# Patient Record
Sex: Female | Born: 1965 | Race: White | Hispanic: No | Marital: Married | Smoking: Never smoker
Health system: Southern US, Community
[De-identification: ages and names within clinical notes are randomized; demographics above are authoritative.]

## PROBLEM LIST (undated history)

## (undated) DIAGNOSIS — E559 Vitamin D deficiency, unspecified: Secondary | ICD-10-CM

## (undated) DIAGNOSIS — K219 Gastro-esophageal reflux disease without esophagitis: Secondary | ICD-10-CM

## (undated) DIAGNOSIS — D649 Anemia, unspecified: Secondary | ICD-10-CM

## (undated) DIAGNOSIS — R059 Cough, unspecified: Secondary | ICD-10-CM

## (undated) HISTORY — DX: Cough, unspecified: R05.9

## (undated) HISTORY — DX: Anemia, unspecified: D64.9

## (undated) HISTORY — DX: Gastro-esophageal reflux disease without esophagitis: K21.9

## (undated) HISTORY — DX: Vitamin D deficiency, unspecified: E55.9

---

## 1991-09-08 HISTORY — PX: TUBAL LIGATION: SHX77

## 2000-04-05 ENCOUNTER — Other Ambulatory Visit: Admission: RE | Admit: 2000-04-05 | Discharge: 2000-04-05 | Payer: Self-pay | Admitting: Obstetrics and Gynecology

## 2002-02-01 ENCOUNTER — Other Ambulatory Visit: Admission: RE | Admit: 2002-02-01 | Discharge: 2002-02-01 | Payer: Self-pay | Admitting: Obstetrics and Gynecology

## 2004-10-30 ENCOUNTER — Encounter: Admission: RE | Admit: 2004-10-30 | Discharge: 2004-10-30 | Payer: Self-pay | Admitting: Obstetrics and Gynecology

## 2008-05-31 ENCOUNTER — Ambulatory Visit (HOSPITAL_COMMUNITY): Admission: RE | Admit: 2008-05-31 | Discharge: 2008-05-31 | Payer: Self-pay | Admitting: Family Medicine

## 2009-11-28 ENCOUNTER — Emergency Department (HOSPITAL_COMMUNITY): Admission: EM | Admit: 2009-11-28 | Discharge: 2009-11-28 | Payer: Self-pay | Admitting: Emergency Medicine

## 2010-09-08 ENCOUNTER — Emergency Department (HOSPITAL_COMMUNITY)
Admission: EM | Admit: 2010-09-08 | Discharge: 2010-09-08 | Payer: Self-pay | Source: Home / Self Care | Admitting: Emergency Medicine

## 2010-09-27 ENCOUNTER — Other Ambulatory Visit: Payer: Self-pay | Admitting: *Deleted

## 2010-09-27 DIAGNOSIS — Z1239 Encounter for other screening for malignant neoplasm of breast: Secondary | ICD-10-CM

## 2010-09-29 ENCOUNTER — Encounter: Payer: Self-pay | Admitting: Family Medicine

## 2010-10-02 ENCOUNTER — Other Ambulatory Visit: Payer: Self-pay

## 2010-10-02 DIAGNOSIS — Z1239 Encounter for other screening for malignant neoplasm of breast: Secondary | ICD-10-CM

## 2010-11-13 ENCOUNTER — Emergency Department (HOSPITAL_COMMUNITY): Payer: 59

## 2010-11-13 ENCOUNTER — Emergency Department (HOSPITAL_COMMUNITY)
Admission: EM | Admit: 2010-11-13 | Discharge: 2010-11-13 | Disposition: A | Discharge status: Home / Self Care | Payer: 59 | Attending: Emergency Medicine | Admitting: Emergency Medicine

## 2010-11-13 DIAGNOSIS — S0510XA Contusion of eyeball and orbital tissues, unspecified eye, initial encounter: Secondary | ICD-10-CM | POA: Insufficient documentation

## 2010-11-13 DIAGNOSIS — W1809XA Striking against other object with subsequent fall, initial encounter: Secondary | ICD-10-CM | POA: Insufficient documentation

## 2010-11-13 DIAGNOSIS — Y9239 Other specified sports and athletic area as the place of occurrence of the external cause: Secondary | ICD-10-CM | POA: Insufficient documentation

## 2010-11-13 DIAGNOSIS — M533 Sacrococcygeal disorders, not elsewhere classified: Secondary | ICD-10-CM | POA: Insufficient documentation

## 2010-11-13 DIAGNOSIS — Y92838 Other recreation area as the place of occurrence of the external cause: Secondary | ICD-10-CM | POA: Insufficient documentation

## 2010-11-13 DIAGNOSIS — M549 Dorsalgia, unspecified: Secondary | ICD-10-CM | POA: Insufficient documentation

## 2010-11-24 ENCOUNTER — Ambulatory Visit
Admission: RE | Admit: 2010-11-24 | Discharge: 2010-11-24 | Disposition: A | Discharge status: Home / Self Care | Payer: 59 | Source: Ambulatory Visit | Attending: *Deleted | Admitting: *Deleted

## 2010-11-24 DIAGNOSIS — Z1239 Encounter for other screening for malignant neoplasm of breast: Secondary | ICD-10-CM

## 2011-10-05 ENCOUNTER — Other Ambulatory Visit (HOSPITAL_COMMUNITY): Payer: Self-pay | Admitting: Family Medicine

## 2011-10-05 DIAGNOSIS — R1011 Right upper quadrant pain: Secondary | ICD-10-CM

## 2011-10-07 ENCOUNTER — Ambulatory Visit (HOSPITAL_COMMUNITY)
Admission: RE | Admit: 2011-10-07 | Discharge: 2011-10-07 | Disposition: A | Discharge status: Home / Self Care | Payer: BC Managed Care – PPO | Source: Ambulatory Visit | Attending: Family Medicine | Admitting: Family Medicine

## 2011-10-07 DIAGNOSIS — R1011 Right upper quadrant pain: Secondary | ICD-10-CM | POA: Insufficient documentation

## 2011-11-16 ENCOUNTER — Telehealth: Payer: Self-pay | Admitting: *Deleted

## 2011-11-16 NOTE — Telephone Encounter (Signed)
Opened in error, wrong pt

## 2012-02-15 ENCOUNTER — Other Ambulatory Visit: Payer: Self-pay | Admitting: Family Medicine

## 2012-02-15 DIAGNOSIS — Z1231 Encounter for screening mammogram for malignant neoplasm of breast: Secondary | ICD-10-CM

## 2012-02-17 ENCOUNTER — Ambulatory Visit
Admission: RE | Admit: 2012-02-17 | Discharge: 2012-02-17 | Disposition: A | Discharge status: Home / Self Care | Payer: BC Managed Care – PPO | Source: Ambulatory Visit | Attending: Family Medicine | Admitting: Family Medicine

## 2012-02-17 DIAGNOSIS — Z1231 Encounter for screening mammogram for malignant neoplasm of breast: Secondary | ICD-10-CM

## 2012-04-11 ENCOUNTER — Telehealth: Payer: Self-pay | Admitting: *Deleted

## 2012-04-11 NOTE — Telephone Encounter (Signed)
Opened in error, wrong pt

## 2013-10-02 ENCOUNTER — Other Ambulatory Visit: Payer: Self-pay

## 2013-10-02 DIAGNOSIS — Z1231 Encounter for screening mammogram for malignant neoplasm of breast: Secondary | ICD-10-CM

## 2013-10-16 ENCOUNTER — Ambulatory Visit
Admission: RE | Admit: 2013-10-16 | Discharge: 2013-10-16 | Disposition: A | Discharge status: Home / Self Care | Payer: BC Managed Care – PPO | Source: Ambulatory Visit

## 2013-10-16 DIAGNOSIS — Z1231 Encounter for screening mammogram for malignant neoplasm of breast: Secondary | ICD-10-CM

## 2014-02-28 ENCOUNTER — Other Ambulatory Visit (HOSPITAL_COMMUNITY): Payer: Self-pay | Admitting: Family Medicine

## 2014-02-28 DIAGNOSIS — R1011 Right upper quadrant pain: Secondary | ICD-10-CM

## 2014-03-05 ENCOUNTER — Encounter (HOSPITAL_COMMUNITY): Payer: Self-pay

## 2014-03-05 ENCOUNTER — Encounter (HOSPITAL_COMMUNITY)
Admission: RE | Admit: 2014-03-05 | Discharge: 2014-03-05 | Disposition: A | Discharge status: Home / Self Care | Payer: BC Managed Care – PPO | Source: Ambulatory Visit | Attending: Family Medicine | Admitting: Family Medicine

## 2014-03-05 DIAGNOSIS — R1011 Right upper quadrant pain: Secondary | ICD-10-CM | POA: Insufficient documentation

## 2014-03-05 MED ORDER — SINCALIDE 5 MCG IJ SOLR
INTRAMUSCULAR | Status: AC
  Administered 2014-03-05: 1.68 ug via INTRAVENOUS
  Filled 2014-03-05: qty 5

## 2014-03-05 MED ORDER — STERILE WATER FOR INJECTION IJ SOLN
INTRAMUSCULAR | Status: AC
  Administered 2014-03-05: 5 mL via INTRAVENOUS
  Filled 2014-03-05: qty 10

## 2014-03-05 MED ORDER — TECHNETIUM TC 99M MEBROFENIN IV KIT
5.0000 | PACK | Freq: Once | INTRAVENOUS | Status: AC | PRN
  Administered 2014-03-05: 5 via INTRAVENOUS

## 2014-03-12 ENCOUNTER — Ambulatory Visit (HOSPITAL_COMMUNITY): Payer: BC Managed Care – PPO

## 2014-07-15 ENCOUNTER — Encounter (HOSPITAL_COMMUNITY): Payer: Self-pay | Admitting: Nurse Practitioner

## 2014-07-15 ENCOUNTER — Emergency Department (HOSPITAL_COMMUNITY)
Admission: EM | Admit: 2014-07-15 | Discharge: 2014-07-15 | Disposition: A | Discharge status: Home / Self Care | Payer: BC Managed Care – PPO | Attending: Emergency Medicine | Admitting: Emergency Medicine

## 2014-07-15 ENCOUNTER — Emergency Department (HOSPITAL_COMMUNITY): Payer: BC Managed Care – PPO

## 2014-07-15 DIAGNOSIS — R0789 Other chest pain: Secondary | ICD-10-CM | POA: Insufficient documentation

## 2014-07-15 DIAGNOSIS — R079 Chest pain, unspecified: Secondary | ICD-10-CM | POA: Diagnosis present

## 2014-07-15 DIAGNOSIS — R002 Palpitations: Secondary | ICD-10-CM | POA: Insufficient documentation

## 2014-07-15 LAB — BASIC METABOLIC PANEL
Anion gap: 12 (ref 5–15)
BUN: 14 mg/dL (ref 6–23)
CHLORIDE: 104 meq/L (ref 96–112)
CO2: 26 mEq/L (ref 19–32)
Calcium: 9.6 mg/dL (ref 8.4–10.5)
Creatinine, Ser: 0.7 mg/dL (ref 0.50–1.10)
GFR calc Af Amer: >90 mL/min (ref >90)
GLUCOSE: 68 mg/dL — AB (ref 70–99)
POTASSIUM: 4 meq/L (ref 3.7–5.3)
Sodium: 142 mEq/L (ref 137–147)

## 2014-07-15 LAB — CBC
HEMATOCRIT: 41.8 % (ref 36.0–46.0)
HEMOGLOBIN: 14.2 g/dL (ref 12.0–15.0)
MCH: 31.3 pg (ref 26.0–34.0)
MCHC: 34 g/dL (ref 30.0–36.0)
MCV: 92.1 fL (ref 78.0–100.0)
Platelets: 341 10*3/uL (ref 150–400)
RBC: 4.54 MIL/uL (ref 3.87–5.11)
RDW: 14.7 % (ref 11.5–15.5)
WBC: 5 10*3/uL (ref 4.0–10.5)

## 2014-07-15 LAB — PRO B NATRIURETIC PEPTIDE: PRO B NATRI PEPTIDE: 122.2 pg/mL (ref 0–125)

## 2014-07-15 LAB — I-STAT TROPONIN, ED: Troponin i, poc: 0.02 ng/mL (ref 0.00–0.08)

## 2014-07-15 LAB — TSH: TSH: 1.06 u[IU]/mL (ref 0.350–4.500)

## 2014-07-15 NOTE — ED Notes (Addendum)
Pt c/o chest pain intermittent x months. States it got worse last night. Symptoms increase at night when she is at rest. States she can feel her heart beating fast at night and for the last several nights shes had R jaw and L arm pain. She reports SOB at times also

## 2014-07-15 NOTE — Discharge Instructions (Signed)
It was our pleasure to provide your ER care today - we hope that you feel better.  Follow up with cardiology in the coming week - see referral - call office Monday to arrange appointment - discuss possible further evaluation of symptoms, and/or Holter monitor.   Return to ER if worse, new symptoms, fevers, difficulty breathing, recurrent or persistent chest pain, persistent fast heart beat, fainting, other concern.     Palpitations A palpitation is the feeling that your heartbeat is irregular or is faster than normal. It may feel like your heart is fluttering or skipping a beat. Palpitations are usually not a serious problem. However, in some cases, you may need further medical evaluation. CAUSES  Palpitations can be caused by:  Smoking.  Caffeine or other stimulants, such as diet pills or energy drinks.  Alcohol.  Stress and anxiety.  Strenuous physical activity.  Fatigue.  Certain medicines.  Heart disease, especially if you have a history of irregular heart rhythms (arrhythmias), such as atrial fibrillation, atrial flutter, or supraventricular tachycardia.  An improperly working pacemaker or defibrillator. DIAGNOSIS  To find the cause of your palpitations, your health care provider will take your medical history and perform a physical exam. Your health care provider may also have you take a test called an ambulatory electrocardiogram (ECG). An ECG records your heartbeat patterns over a 24-hour period. You may also have other tests, such as:  Transthoracic echocardiogram (TTE). During echocardiography, sound waves are used to evaluate how blood flows through your heart.  Transesophageal echocardiogram (TEE).  Cardiac monitoring. This allows your health care provider to monitor your heart rate and rhythm in real time.  Holter monitor. This is a portable device that records your heartbeat and can help diagnose heart arrhythmias. It allows your health care provider to track your  heart activity for several days, if needed.  Stress tests by exercise or by giving medicine that makes the heart beat faster. TREATMENT  Treatment of palpitations depends on the cause of your symptoms and can vary greatly. Most cases of palpitations do not require any treatment other than time, relaxation, and monitoring your symptoms. Other causes, such as atrial fibrillation, atrial flutter, or supraventricular tachycardia, usually require further treatment. HOME CARE INSTRUCTIONS   Avoid:  Caffeinated coffee, tea, soft drinks, diet pills, and energy drinks.  Chocolate.  Alcohol.  Stop smoking if you smoke.  Reduce your stress and anxiety. Things that can help you relax include:  A method of controlling things in your body, such as your heartbeats, with your mind (biofeedback).  Yoga.  Meditation.  Physical activity such as swimming, jogging, or walking.  Get plenty of rest and sleep. SEEK MEDICAL CARE IF:   You continue to have a fast or irregular heartbeat beyond 24 hours.  Your palpitations occur more often. SEEK IMMEDIATE MEDICAL CARE IF:  You have chest pain or shortness of breath.  You have a severe headache.  You feel dizzy or you faint. MAKE SURE YOU:  Understand these instructions.  Will watch your condition.  Will get help right away if you are not doing well or get worse. Document Released: 08/21/2000 Document Revised: 08/29/2013 Document Reviewed: 10/23/2011 [facility] Patient Information 2015 [place], [place]. This information is not intended to replace advice given to you by your health care provider. Make sure you discuss any questions you have with your health care provider.     Chest Pain (Nonspecific) It is often hard to give a specific diagnosis for the cause of  chest pain. There is always a chance that your pain could be related to something serious, such as a heart attack or a blood clot in the lungs. You need to follow up with your health  care provider for further evaluation. CAUSES   Heartburn.  Pneumonia or bronchitis.  Anxiety or stress.  Inflammation around your heart (pericarditis) or lung (pleuritis or pleurisy).  A blood clot in the lung.  A collapsed lung (pneumothorax). It can develop suddenly on its own (spontaneous pneumothorax) or from trauma to the chest.  Shingles infection (herpes zoster virus). The chest wall is composed of bones, muscles, and cartilage. Any of these can be the source of the pain.  The bones can be bruised by injury.  The muscles or cartilage can be strained by coughing or overwork.  The cartilage can be affected by inflammation and become sore (costochondritis). DIAGNOSIS  Lab tests or other studies may be needed to find the cause of your pain. Your health care provider may have you take a test called an ambulatory electrocardiogram (ECG). An ECG records your heartbeat patterns over a 24-hour period. You may also have other tests, such as:  Transthoracic echocardiogram (TTE). During echocardiography, sound waves are used to evaluate how blood flows through your heart.  Transesophageal echocardiogram (TEE).  Cardiac monitoring. This allows your health care provider to monitor your heart rate and rhythm in real time.  Holter monitor. This is a portable device that records your heartbeat and can help diagnose heart arrhythmias. It allows your health care provider to track your heart activity for several days, if needed.  Stress tests by exercise or by giving medicine that makes the heart beat faster. TREATMENT   Treatment depends on what may be causing your chest pain. Treatment may include:  Acid blockers for heartburn.  Anti-inflammatory medicine.  Pain medicine for inflammatory conditions.  Antibiotics if an infection is present.  You may be advised to change lifestyle habits. This includes stopping smoking and avoiding alcohol, caffeine, and chocolate.  You may be  advised to keep your head raised (elevated) when sleeping. This reduces the chance of acid going backward from your stomach into your esophagus. Most of the time, nonspecific chest pain will improve within 2-3 days with rest and mild pain medicine.  HOME CARE INSTRUCTIONS   If antibiotics were prescribed, take them as directed. Finish them even if you start to feel better.  For the next few days, avoid physical activities that bring on chest pain. Continue physical activities as directed.  Do not use any tobacco products, including cigarettes, chewing tobacco, or electronic cigarettes.  Avoid drinking alcohol.  Only take medicine as directed by your health care provider.  Follow your health care provider's suggestions for further testing if your chest pain does not go away.  Keep any follow-up appointments you made. If you do not go to an appointment, you could develop lasting (chronic) problems with pain. If there is any problem keeping an appointment, call to reschedule. SEEK MEDICAL CARE IF:   Your chest pain does not go away, even after treatment.  You have a rash with blisters on your chest.  You have a fever. SEEK IMMEDIATE MEDICAL CARE IF:   You have increased chest pain or pain that spreads to your arm, neck, jaw, back, or abdomen.  You have shortness of breath.  You have an increasing cough, or you cough up blood.  You have severe back or abdominal pain.  You feel nauseous or  vomit.  You have severe weakness.  You faint.  You have chills. This is an emergency. Do not wait to see if the pain will go away. Get medical help at once. Call your local emergency services (911 in [place] Do not drive yourself to the hospital. MAKE SURE YOU:   Understand these instructions.  Will watch your condition.  Will get help right away if you are not doing well or get worse. Document Released: 06/03/2005 Document Revised: 08/29/2013 Document Reviewed: 03/29/2008 [facility]  Patient Information 2015 [place], [place]. This information is not intended to replace advice given to you by your health care provider. Make sure you discuss any questions you have with your health care provider.     Shortness of Breath Shortness of breath means you have trouble breathing. It could also mean that you have a medical problem. You should get immediate medical care for shortness of breath. CAUSES   Not enough oxygen in the air such as with high altitudes or a smoke-filled room.  Certain lung diseases, infections, or problems.  Heart disease or conditions, such as angina or heart failure.  Low red blood cells (anemia).  Poor physical fitness, which can cause shortness of breath when you exercise.  Chest or back injuries or stiffness.  Being overweight.  Smoking.  Anxiety, which can make you feel like you are not getting enough air. DIAGNOSIS  Serious medical problems can often be found during your physical exam. Tests may also be done to determine why you are having shortness of breath. Tests may include:  Chest X-rays.  Lung function tests.  Blood tests.  An electrocardiogram (ECG).  An ambulatory electrocardiogram. An ambulatory ECG records your heartbeat patterns over a 24-hour period.  Exercise testing.  A transthoracic echocardiogram (TTE). During echocardiography, sound waves are used to evaluate how blood flows through your heart.  A transesophageal echocardiogram (TEE).  Imaging scans. Your health care provider may not be able to find a cause for your shortness of breath after your exam. In this case, it is important to have a follow-up exam with your health care provider as directed.  TREATMENT  Treatment for shortness of breath depends on the cause of your symptoms and can vary greatly. HOME CARE INSTRUCTIONS   Do not smoke. Smoking is a common cause of shortness of breath. If you smoke, ask for help to quit.  Avoid being around chemicals or  things that may bother your breathing, such as paint fumes and dust.  Rest as needed. Slowly resume your usual activities.  If medicines were prescribed, take them as directed for the full length of time directed. This includes oxygen and any inhaled medicines.  Keep all follow-up appointments as directed by your health care provider. SEEK MEDICAL CARE IF:   Your condition does not improve in the time expected.  You have a hard time doing your normal activities even with rest.  You have any new symptoms. SEEK IMMEDIATE MEDICAL CARE IF:   Your shortness of breath gets worse.  You feel light-headed, faint, or develop a cough not controlled with medicines.  You start coughing up blood.  You have pain with breathing.  You have chest pain or pain in your arms, shoulders, or abdomen.  You have a fever.  You are unable to walk up stairs or exercise the way you normally do. MAKE SURE YOU:  Understand these instructions.  Will watch your condition.  Will get help right away if you are not doing well  or get worse. Document Released: 05/19/2001 Document Revised: 08/29/2013 Document Reviewed: 11/09/2011 [facility] Patient Information 2015 [place], [place]. This information is not intended to replace advice given to you by your health care provider. Make sure you discuss any questions you have with your health care provider.

## 2014-07-15 NOTE — ED Notes (Signed)
Patient transported to X-ray 

## 2014-07-15 NOTE — ED Provider Notes (Signed)
CSN: [id number]     Arrival date & time 07/15/14  1155 History   First MD Initiated Contact with Patient 07/15/14 1205     Chief Complaint  Patient presents with  . Chest Pain     (Consider location/radiation/quality/duration/timing/severity/associated sxs/prior Treatment) Patient is a 48 y.o. female presenting with chest pain. The history is provided by the patient.  Chest Pain Associated symptoms: palpitations   Associated symptoms: no abdominal pain, no back pain, no fever, no headache, no numbness, no shortness of breath, not vomiting and no weakness   pt states feeling of mild sob x several months - no recent change. Indicates in past couple nights, when at rest, has noted intermittent sense of palpitations, at times rapid, at other times more irregular, lasts seconds. No hx syncope. Also states last night was awakened w transient right jaw pain, then later was awakened with left elbow pain.  Pt denies any current jaw or elbow pain. No exertional cp or discomfort of any sort. No unusual fatigue. w above symptoms, denies any associated nv or diaphoresis. Denies hx dysrhythmia or hx cad. No fam hx cad. Denies heat intolerance, sweats, unexplained wt loss, or hx thyroid disease. No recent medication use. Occasionally drinks 1 caffeinated beverage per day. No energy drink use. Non smoker.  Pt denies leg pain or swelling. No orthopnea or pnd. No hx dvt or pe.       History reviewed. No pertinent past medical history. Past Surgical History  Procedure Laterality Date  . Tubal ligation     History reviewed. No pertinent family history. History  Substance Use Topics  . Smoking status: Never Smoker   . Smokeless tobacco: Not on file  . Alcohol Use: No   OB History    No data available     Review of Systems  Constitutional: Negative for fever and chills.  HENT: Negative for sore throat.   Eyes: Negative for visual disturbance.  Respiratory: Negative for shortness of breath.    Cardiovascular: Positive for palpitations. Negative for leg swelling.  Gastrointestinal: Negative for vomiting and abdominal pain.  Genitourinary: Negative for flank pain.  Musculoskeletal: Negative for back pain and neck pain.  Skin: Negative for rash.  Neurological: Negative for syncope, weakness, numbness and headaches.  Hematological: Does not bruise/bleed easily.  Psychiatric/Behavioral: Negative for confusion.      Allergies  Review of patient's allergies indicates no known allergies.  Home Medications   Prior to Admission medications   Not on File   BP 146/79 mmHg  Pulse 55  Temp(Src) 97.6 F (36.4 C) (Oral)  Resp 18  SpO2 100% Physical Exam  Constitutional: She appears well-developed and well-nourished. No distress.  HENT:  Mouth/Throat: Oropharynx is clear and moist.  Eyes: Conjunctivae are normal. No scleral icterus.  Neck: Neck supple. No tracheal deviation present. No thyromegaly present.  Cardiovascular: Normal rate, regular rhythm, normal heart sounds and intact distal pulses.  Exam reveals no gallop and no friction rub.   No murmur heard. Pulmonary/Chest: Effort normal and breath sounds normal. No respiratory distress.  Abdominal: Soft. Normal appearance and bowel sounds are normal. She exhibits no distension. There is no tenderness.  Musculoskeletal: She exhibits no edema.  Neurological: She is alert.  Skin: Skin is warm and dry. No rash noted. She is not diaphoretic.  Psychiatric: She has a normal mood and affect.  Nursing note and vitals reviewed.   ED Course  Procedures (including critical care time) Labs Review  Results for orders placed  or performed during the hospital encounter of 07/15/14  CBC  Result Value Ref Range   WBC 5.0 4.0 - 10.5 K/uL   RBC 4.54 3.87 - 5.11 MIL/uL   Hemoglobin 14.2 12.0 - 15.0 g/dL   HCT 40.941.8 81.136.0 - 91.446.0 %   MCV 92.1 78.0 - 100.0 fL   MCH 31.3 26.0 - 34.0 pg   MCHC 34.0 30.0 - 36.0 g/dL   RDW 78.214.7 95.611.5 - 21.315.5 %    Platelets 341 150 - 400 K/uL  Basic metabolic panel  Result Value Ref Range   Sodium 142 137 - 147 mEq/L   Potassium 4.0 3.7 - 5.3 mEq/L   Chloride 104 96 - 112 mEq/L   CO2 26 19 - 32 mEq/L   Glucose, Bld 68 (L) 70 - 99 mg/dL   BUN 14 6 - 23 mg/dL   Creatinine, Ser 0.860.70 0.50 - 1.10 mg/dL   Calcium 9.6 8.4 - 57.810.5 mg/dL   GFR calc non Af Amer >90 >90 mL/min   GFR calc Af Amer >90 >90 mL/min   Anion gap 12 5 - 15  BNP (order ONLY if patient complains of dyspnea/SOB AND you have documented it for THIS visit)  Result Value Ref Range   Pro B Natriuretic peptide (BNP) 122.2 0 - 125 pg/mL  TSH  Result Value Ref Range   TSH 1.060 0.350 - 4.500 uIU/mL  I-stat troponin, ED [facility])  Result Value Ref Range   Troponin i, poc 0.02 0.00 - 0.08 ng/mL   Comment 3           Dg Chest 2 View  07/15/2014   CLINICAL DATA:  48 year old female with chest pain radiating to the left arm and sensation of heart racing  EXAM: CHEST  2 VIEW  COMPARISON:  Prior chest x-ray 06/11/2014  FINDINGS: Stable cardiac and mediastinal contours which remain within normal limits. No focal airspace consolidation, evidence of pulmonary edema, pleural effusion or pneumothorax. No acute osseous abnormality. Degenerative change at the right acromioclavicular which.  IMPRESSION: No active cardiopulmonary disease.   Electronically Signed   By: Malachy MoanHeath  McCullough M.D.   On: 07/15/2014 13:08      EKG Interpretation   Date/Time:  Sunday July 15 2014 12:02:48 EST Ventricular Rate:  54 PR Interval:  130 QRS Duration: 84 QT Interval:  410 QTC Calculation: 388 R Axis:   1 Text Interpretation:  Sinus bradycardia No significant change since last  tracing Confirmed by Denton LankSTEINL  MD, Caryn BeeKEVIN ([id number]) on 07/15/2014 12:17:33 PM      MDM   Labs. Cxr. Ecg.  Reviewed nursing notes and prior charts for additional history.   Recheck nsr on monitor, no ectopy or dysrhythmia.  Hr 62, rr 14, pulse ox 99%.   No pain or  discomfort, breathing comfortably.  Discussed lab results w pt.  Given palpitations and atyp cp, will have f/u with cardiology, outpt f/u.      Suzi RootsKevin E Yuvonne Lanahan, MD 07/15/14 1430

## 2014-07-16 LAB — T4, FREE: FREE T4: 1.16 ng/dL (ref 0.80–1.80)

## 2014-11-23 ENCOUNTER — Other Ambulatory Visit: Payer: Self-pay

## 2014-11-23 DIAGNOSIS — Z1231 Encounter for screening mammogram for malignant neoplasm of breast: Secondary | ICD-10-CM

## 2014-11-29 ENCOUNTER — Ambulatory Visit
Admission: RE | Admit: 2014-11-29 | Discharge: 2014-11-29 | Disposition: A | Discharge status: Home / Self Care | Payer: BLUE CROSS/BLUE SHIELD | Source: Ambulatory Visit

## 2014-11-29 DIAGNOSIS — Z1231 Encounter for screening mammogram for malignant neoplasm of breast: Secondary | ICD-10-CM

## 2014-11-30 ENCOUNTER — Other Ambulatory Visit: Payer: Self-pay | Admitting: Family Medicine

## 2014-11-30 DIAGNOSIS — R928 Other abnormal and inconclusive findings on diagnostic imaging of breast: Secondary | ICD-10-CM

## 2014-12-03 ENCOUNTER — Other Ambulatory Visit: Payer: Self-pay | Admitting: Family Medicine

## 2014-12-03 ENCOUNTER — Ambulatory Visit
Admission: RE | Admit: 2014-12-03 | Discharge: 2014-12-03 | Disposition: A | Discharge status: Home / Self Care | Payer: BLUE CROSS/BLUE SHIELD | Source: Ambulatory Visit | Attending: Family Medicine | Admitting: Family Medicine

## 2014-12-03 DIAGNOSIS — R928 Other abnormal and inconclusive findings on diagnostic imaging of breast: Secondary | ICD-10-CM

## 2015-01-06 HISTORY — PX: UPPER GASTROINTESTINAL ENDOSCOPY: SHX188

## 2015-01-17 ENCOUNTER — Encounter: Payer: Self-pay | Admitting: Gastroenterology

## 2015-01-17 ENCOUNTER — Ambulatory Visit (INDEPENDENT_AMBULATORY_CARE_PROVIDER_SITE_OTHER): Payer: BLUE CROSS/BLUE SHIELD | Admitting: Gastroenterology

## 2015-01-17 VITALS — BP 128/82 | HR 67 | Ht 63.0 in | Wt 197.6 lb

## 2015-01-17 DIAGNOSIS — R079 Chest pain, unspecified: Secondary | ICD-10-CM | POA: Diagnosis not present

## 2015-01-17 DIAGNOSIS — K219 Gastro-esophageal reflux disease without esophagitis: Secondary | ICD-10-CM

## 2015-01-17 MED ORDER — PANTOPRAZOLE SODIUM 40 MG PO TBEC: 40.0000 mg | DELAYED_RELEASE_TABLET | Freq: Every day | ORAL | Status: DC

## 2015-01-17 NOTE — Progress Notes (Signed)
    History of Present Illness: This is a 49 year old female referred by Richardean Chimeraaniel, Terry G, MD for the evaluation of chest pain. She relates the onset of recurrent midsternal chest pain beginning in November 2015. CCK HIDA was normal. She states she had a exercise stress test that was also normal. Over the past few months she has noted frequent postprandial and morning belching with heartburn. She notes frequent heartburn and reflux symptoms with recumbency. She was treated with OTC Prilosec for 2 weeks with improvement in her symptoms however she is no longer on any acid reducing medication. Denies weight loss, abdominal pain, constipation, diarrhea, change in stool caliber, melena, hematochezia, nausea, vomiting, dysphagia.   Review of Systems: Pertinent positive and negative review of systems were noted in the above HPI section. All other review of systems were otherwise negative.  Current Medications, Allergies, Past Medical History, Past Surgical History, Family History and Social History [facility].  Physical Exam: General: Well developed, well nourished, no acute distress Head: Normocephalic and atraumatic Eyes:  sclerae anicteric, EOMI Ears: Normal auditory acuity Mouth: No deformity or lesions Neck: Supple, no masses or thyromegaly Lungs: Clear throughout to auscultation Heart: Regular rate and rhythm; no murmurs, rubs or bruits Abdomen: Soft, non tender and non distended. No masses, hepatosplenomegaly or hernias noted. Normal Bowel sounds Musculoskeletal: Symmetrical with no gross deformities  Skin: No lesions on visible extremities Pulses:  Normal pulses noted Extremities: No clubbing, cyanosis, edema or deformities noted Neurological: Alert oriented x 4, grossly nonfocal Cervical Nodes:  No significant cervical adenopathy Inguinal Nodes: No significant inguinal adenopathy Psychological:  Alert and cooperative. Normal mood and  affect  Assessment and Recommendations:  1. Chest pain, likely secondary to GERD. Begin pantoprazole 40 mg daily and all standard antireflux measures. Schedule endoscopy to rule out esophagitis, ulcers, neoplasms and other disorders. The risks (including bleeding, perforation, infection, missed lesions, medication reactions and possible hospitalization or surgery if complications occur), benefits, and alternatives to endoscopy with possible biopsy and possible dilation were discussed with the patient and they consent to proceed.   2. Colorectal cancer screening, average risk. Will discuss colonoscopy with her at age 49 after her upper gastrointestinal tract symptoms have been evaluated and are under good control.   cc: Richardean Chimeraerry G Daniel, MD [address] [place], [place] [id number]

## 2015-01-17 NOTE — Patient Instructions (Signed)
We have sent the following medications to your pharmacy for you to pick up at your convenience:protonix.  Patient advised to avoid spicy, acidic, citrus, chocolate, mints, fruit and fruit juices.  Limit the intake of caffeine, alcohol and Soda.  Don't exercise too soon after eating.  Don't lie down within 3-4 hours of eating.  Elevate the head of your bed.  You have been scheduled for an endoscopy. Please follow written instructions given to you at your visit today. If you use inhalers (even only as needed), please bring them with you on the day of your procedure. Your physician has requested that you go to www.startemmi.com and enter the access code given to you at your visit today. This web site gives a general overview about your procedure. However, you should still follow specific instructions given to you by our office regarding your preparation for the procedure.  Thank you for choosing me and [facility].  Venita LickMalcolm T. Pleas KochStark, Jr., MD., Clementeen GrahamFACG  cc: Donzetta Sprunganiel Terry, MD

## 2015-01-22 ENCOUNTER — Encounter: Payer: Self-pay | Admitting: Gastroenterology

## 2015-01-28 ENCOUNTER — Encounter: Payer: Self-pay | Admitting: Gastroenterology

## 2015-01-28 ENCOUNTER — Ambulatory Visit (AMBULATORY_SURGERY_CENTER): Payer: BLUE CROSS/BLUE SHIELD | Admitting: Gastroenterology

## 2015-01-28 VITALS — BP 127/78 | HR 50 | Temp 96.7°F | Resp 28 | Ht 63.0 in | Wt 197.0 lb

## 2015-01-28 DIAGNOSIS — R079 Chest pain, unspecified: Secondary | ICD-10-CM

## 2015-01-28 DIAGNOSIS — K2900 Acute gastritis without bleeding: Secondary | ICD-10-CM | POA: Diagnosis not present

## 2015-01-28 DIAGNOSIS — K2100 Gastro-esophageal reflux disease with esophagitis, without bleeding: Secondary | ICD-10-CM

## 2015-01-28 DIAGNOSIS — K2960 Other gastritis without bleeding: Secondary | ICD-10-CM

## 2015-01-28 DIAGNOSIS — K219 Gastro-esophageal reflux disease without esophagitis: Secondary | ICD-10-CM | POA: Insufficient documentation

## 2015-01-28 DIAGNOSIS — K2950 Unspecified chronic gastritis without bleeding: Secondary | ICD-10-CM | POA: Diagnosis not present

## 2015-01-28 MED ORDER — PANTOPRAZOLE SODIUM 40 MG PO TBEC: 40.0000 mg | DELAYED_RELEASE_TABLET | Freq: Every day | ORAL | Status: DC

## 2015-01-28 MED ORDER — SODIUM CHLORIDE 0.9 % IV SOLN: 500.0000 mL | INTRAVENOUS | Status: DC

## 2015-01-28 NOTE — Patient Instructions (Signed)
YOU HAD AN ENDOSCOPIC PROCEDURE TODAY AT THE [facility]:   Refer to the procedure report that was given to you for any specific questions about what was found during the examination.  If the procedure report does not answer your questions, please call your gastroenterologist to clarify.  If you requested that your care partner not be given the details of your procedure findings, then the procedure report has been included in a sealed envelope for you to review at your convenience later.  YOU SHOULD EXPECT: Some feelings of bloating in the abdomen. Passage of more gas than usual.  Walking can help get rid of the air that was put into your GI tract during the procedure and reduce the bloating. If you had a lower endoscopy (such as a colonoscopy or flexible sigmoidoscopy) you may notice spotting of blood in your stool or on the toilet paper. If you underwent a bowel prep for your procedure, you may not have a normal bowel movement for a few days.  Please Note:  You might notice some irritation and congestion in your nose or some drainage.  This is from the oxygen used during your procedure.  There is no need for concern and it should clear up in a day or so.  SYMPTOMS TO REPORT IMMEDIATELY:    Following upper endoscopy (EGD)  Vomiting of blood or coffee ground material  New chest pain or pain under the shoulder blades  Painful or persistently difficult swallowing  New shortness of breath  Fever of 100F or higher  Black, tarry-looking stools  For urgent or emergent issues, a gastroenterologist can be reached at any hour by calling [phone].   DIET: Your first meal following the procedure should be a small meal and then it is ok to progress to your normal diet. Heavy or fried foods are harder to digest and may make you feel nauseous or bloated.  Likewise, meals heavy in dairy and vegetables can increase bloating.  Drink plenty of fluids but you should avoid alcoholic beverages  for 24 hours. Read the anti-reflux diet, and change your diet accordingly.  ACTIVITY:  You should plan to take it easy for the rest of today and you should NOT DRIVE or use heavy machinery until tomorrow (because of the sedation medicines used during the test).    FOLLOW UP: Our staff will call the number listed on your records the next business day following your procedure to check on you and address any questions or concerns that you may have regarding the information given to you following your procedure. If we do not reach you, we will leave a message.  However, if you are feeling well and you are not experiencing any problems, there is no need to return our call.  We will assume that you have returned to your regular daily activities without incident.  If any biopsies were taken you will be contacted by phone or by letter within the next 1-3 weeks.  Please call us at [phone] if you have not heard about the biopsies in 3 weeks.    SIGNATURES/CONFIDENTIALITY: You and/or your care partner have signed paperwork which will be entered into your electronic medical record.  These signatures attest to the fact that that the information above on your After Visit Summary has been reviewed and is understood.  Full responsibility of the confidentiality of this discharge information lies with you and/or your care-partner.  Try to minimize the use of aspirin and NSAIDS to  reduce the acid erosions in your stomach.

## 2015-01-28 NOTE — Progress Notes (Signed)
Stable to RR 

## 2015-01-28 NOTE — Op Note (Signed)
[facility] [address] [place], [id number]   ENDOSCOPY PROCEDURE REPORT  PATIENT: Jillian Walker, Jillian Walker  MR#: [id number] BIRTHDATE: Nov 02, 1965 , 48  yrs. old GENDER: female ENDOSCOPIST: Meryl DareMalcolm T Stark, MD, [facility] REFERRED BY:  Donzetta Sprungerry Daniel, [name] PROCEDURE DATE:  01/28/2015 PROCEDURE:  EGD w/ biopsy ASA CLASS:     Class II INDICATIONS:  chest pain and history of esophageal reflux. MEDICATIONS: Monitored anesthesia care, Propofol 100 mg IV, and lidocaine 40 mg IV TOPICAL ANESTHETIC: none DESCRIPTION OF PROCEDURE: After the risks benefits and alternatives of the procedure were thoroughly explained, informed consent was obtained.  The LB [id number] endoscope was introduced through the mouth and advanced to the second portion of the duodenum , Without limitations.  The instrument was slowly withdrawn as the mucosa was fully examined.    ESOPHAGUS: There was LA Class B esophagitis (One or more mucosal breaks > 5mm, but without continuity across mucosal folds) noted. The esophagus otherwise appeared normal. STOMACH: Moderate erosive gastritis was found in the gastric body and gastric antrum.  Multiple biopsies were performed.  The stomach otherwise appeared normal. DUODENUM: Mild duodenitis, nonerosive, was found in the duodenal bulb. The duodenal mucosa showed no abnormalities in the 2nd part of the duodenum.  Retroflexed views revealed a small hiatal hernia. The scope was then withdrawn from the patient and the procedure completed.  COMPLICATIONS: There were no immediate complications.  ENDOSCOPIC IMPRESSION: 1.   LA Class B esophagitis 2.   Erosive gastritis in the gastric body and gastric antrum; multiple biopsies performed 3.   Small hiatal hernia 4.   Duodenitis, nonerosive in the duodenal bulb  RECOMMENDATIONS: 1.  Anti-reflux regimen long term 2.  Minimize/avoid ASA/NSAID useage 3.  Await pathology results 4.  Continue PPI qam long  term  eSigned:  Meryl DareMalcolm T Stark, MD, [facility] GrahamFACG 01/28/2015 2:22 PM

## 2015-01-28 NOTE — Progress Notes (Signed)
Called to room to assist during endoscopic procedure.  Patient ID and intended procedure confirmed with present staff. Received instructions for my participation in the procedure from the performing physician.  

## 2015-01-29 ENCOUNTER — Telehealth: Payer: Self-pay | Admitting: *Deleted

## 2015-01-29 NOTE — Telephone Encounter (Signed)
  Follow up Call-  Call back number 01/28/2015  Post procedure Call Back phone  # [phone]  Permission to leave phone message Yes     Patient questions:  Do you have a fever, pain , or abdominal swelling? No. Pain Score  0 *  Have you tolerated food without any problems? Yes.    Have you been able to return to your normal activities? Yes.    Do you have any questions about your discharge instructions: Diet   No. Medications  No. Follow up visit  No.  Do you have questions or concerns about your Care? No.  Actions: * If pain score is 4 or above: No action needed, pain <4.

## 2015-02-05 ENCOUNTER — Encounter: Payer: Self-pay | Admitting: Gastroenterology

## 2015-03-27 ENCOUNTER — Encounter: Payer: Self-pay | Admitting: Gastroenterology

## 2015-04-30 ENCOUNTER — Ambulatory Visit (AMBULATORY_SURGERY_CENTER): Payer: BLUE CROSS/BLUE SHIELD

## 2015-04-30 VITALS — Ht 63.0 in | Wt 204.0 lb

## 2015-04-30 DIAGNOSIS — Z1211 Encounter for screening for malignant neoplasm of colon: Secondary | ICD-10-CM

## 2015-04-30 MED ORDER — SUPREP BOWEL PREP KIT 17.5-3.13-1.6 GM/177ML PO SOLN: 1.0000 | Freq: Once | ORAL | Status: DC

## 2015-04-30 NOTE — Progress Notes (Signed)
Patient denies any allergies to egg or soy products. Patient denies complications with anesthesia/sedation.  Patient denies oxygen use at home and denies diet medications. Emmi instructions for colonoscopy explained and given to patient.  

## 2015-05-02 ENCOUNTER — Encounter: Payer: Self-pay | Admitting: Gastroenterology

## 2015-05-14 ENCOUNTER — Encounter: Payer: BLUE CROSS/BLUE SHIELD | Admitting: Gastroenterology

## 2015-05-29 ENCOUNTER — Ambulatory Visit (AMBULATORY_SURGERY_CENTER): Payer: BLUE CROSS/BLUE SHIELD | Admitting: Gastroenterology

## 2015-05-29 ENCOUNTER — Encounter: Payer: Self-pay | Admitting: Gastroenterology

## 2015-05-29 VITALS — BP 125/68 | HR 54 | Temp 98.2°F | Resp 16 | Ht 63.0 in | Wt 197.0 lb

## 2015-05-29 DIAGNOSIS — Z1211 Encounter for screening for malignant neoplasm of colon: Secondary | ICD-10-CM | POA: Diagnosis not present

## 2015-05-29 MED ORDER — SODIUM CHLORIDE 0.9 % IV SOLN: 500.0000 mL | INTRAVENOUS | Status: DC

## 2015-05-29 NOTE — Op Note (Signed)
[facility] [address] [place], [address]   COLONOSCOPY PROCEDURE REPORT  PATIENT: Jillian, Walker  MR#: [id number] BIRTHDATE: 01-29-1966 , 49  yrs. old GENDER: female ENDOSCOPIST: Meryl Dare, MD, [facility] REFERRED XB:JYNWG Reuel Boom, M.D. PROCEDURE DATE:  05/29/2015 PROCEDURE:   Colonoscopy, screening First Screening Colonoscopy - Avg.  risk and is 50 yrs.  old or older Yes.  Prior Negative Screening - Now for repeat screening. N/A  History of Adenoma - Now for follow-up colonoscopy & has been > or = to 3 yrs.  N/A  Polyps removed today? No Recommend repeat exam, <10 yrs? No ASA CLASS:   Class II INDICATIONS:Screening for colonic neoplasia and Colorectal Neoplasm Risk Assessment for this procedure is average risk. MEDICATIONS: Monitored anesthesia care and Propofol 300 mg IV DESCRIPTION OF PROCEDURE:   After the risks benefits and alternatives of the procedure were thoroughly explained, informed consent was obtained.  The digital rectal exam revealed no abnormalities of the rectum.   The LB 1528  endoscope was introduced through the anus and advanced to the cecum, which was identified by both the appendix and ileocecal valve. No adverse events experienced.   The quality of the prep was excellent. (Suprep was used)  The instrument was then slowly withdrawn as the colon was fully examined. Estimated blood loss is zero unless otherwise noted in this procedure report.    COLON FINDINGS: A normal appearing cecum, ileocecal valve, and appendiceal orifice were identified.  The ascending, transverse, descending, sigmoid colon, and rectum appeared unremarkable. Retroflexed views revealed no abnormalities. The time to cecum = 2.8 Withdrawal time = 9.0   The scope was withdrawn and the procedure completed. COMPLICATIONS: There were no immediate complications.  ENDOSCOPIC IMPRESSION: Normal colonoscopy  RECOMMENDATIONS: Continue current colorectal screening  recommendations for "routine risk" patients with a repeat colonoscopy in 10 years.  eSigned:  Meryl Dare, MD, [facility] 05/29/2015 11:31 AM

## 2015-05-29 NOTE — Patient Instructions (Signed)
Impressions/recommendations:  Normal colonoscopy  Repeat colonoscopy in 10 years.  YOU HAD AN ENDOSCOPIC PROCEDURE TODAY AT THE [facility]:   Refer to the procedure report that was given to you for any specific questions about what was found during the examination.  If the procedure report does not answer your questions, please call your gastroenterologist to clarify.  If you requested that your care partner not be given the details of your procedure findings, then the procedure report has been included in a sealed envelope for you to review at your convenience later.  YOU SHOULD EXPECT: Some feelings of bloating in the abdomen. Passage of more gas than usual.  Walking can help get rid of the air that was put into your GI tract during the procedure and reduce the bloating. If you had a lower endoscopy (such as a colonoscopy or flexible sigmoidoscopy) you may notice spotting of blood in your stool or on the toilet paper. If you underwent a bowel prep for your procedure, you may not have a normal bowel movement for a few days.  Please Note:  You might notice some irritation and congestion in your nose or some drainage.  This is from the oxygen used during your procedure.  There is no need for concern and it should clear up in a day or so.  SYMPTOMS TO REPORT IMMEDIATELY:   Following lower endoscopy (colonoscopy or flexible sigmoidoscopy):  Excessive amounts of blood in the stool  Significant tenderness or worsening of abdominal pains  Swelling of the abdomen that is new, acute  Fever of 100F or higher  For urgent or emergent issues, a gastroenterologist can be reached at any hour by calling [phone].   DIET: Your first meal following the procedure should be a small meal and then it is ok to progress to your normal diet. Heavy or fried foods are harder to digest and may make you feel nauseous or bloated.  Likewise, meals heavy in dairy and vegetables can increase bloating.   Drink plenty of fluids but you should avoid alcoholic beverages for 24 hours.  ACTIVITY:  You should plan to take it easy for the rest of today and you should NOT DRIVE or use heavy machinery until tomorrow (because of the sedation medicines used during the test).    FOLLOW UP: Our staff will call the number listed on your records the next business day following your procedure to check on you and address any questions or concerns that you may have regarding the information given to you following your procedure. If we do not reach you, we will leave a message.  However, if you are feeling well and you are not experiencing any problems, there is no need to return our call.  We will assume that you have returned to your regular daily activities without incident.  If any biopsies were taken you will be contacted by phone or by letter within the next 1-3 weeks.  Please call us at [phone] if you have not heard about the biopsies in 3 weeks.    SIGNATURES/CONFIDENTIALITY: You and/or your care partner have signed paperwork which will be entered into your electronic medical record.  These signatures attest to the fact that that the information above on your After Visit Summary has been reviewed and is understood.  Full responsibility of the confidentiality of this discharge information lies with you and/or your care-partner. 

## 2015-05-29 NOTE — Progress Notes (Signed)
Report to PACU, RN, vss, BBS= Clear.  

## 2015-05-30 ENCOUNTER — Telehealth: Payer: Self-pay | Admitting: *Deleted

## 2015-05-30 ENCOUNTER — Telehealth: Payer: Self-pay | Admitting: Gastroenterology

## 2015-05-30 NOTE — Telephone Encounter (Signed)
Patient reports that she was not able to sleep last night after her procedure.  I explained that unlikely a side effect of the propofol. I reviewed with her that the drug almost completely clears he system in an hour.  She has not other complaints, just wanted to know if this was from propofol.

## 2015-05-30 NOTE — Telephone Encounter (Signed)
  Follow up Call-  Call back number 05/29/2015 01/28/2015  Post procedure Call Back phone  # [phone]  Permission to leave phone message Yes Yes    LMOM

## 2015-11-15 ENCOUNTER — Other Ambulatory Visit: Payer: Self-pay

## 2015-11-15 DIAGNOSIS — Z1231 Encounter for screening mammogram for malignant neoplasm of breast: Secondary | ICD-10-CM

## 2015-12-16 ENCOUNTER — Ambulatory Visit: Payer: BLUE CROSS/BLUE SHIELD

## 2015-12-30 ENCOUNTER — Ambulatory Visit: Payer: BLUE CROSS/BLUE SHIELD

## 2016-02-10 ENCOUNTER — Other Ambulatory Visit: Payer: Self-pay | Admitting: Family Medicine

## 2016-02-10 ENCOUNTER — Ambulatory Visit
Admission: RE | Admit: 2016-02-10 | Discharge: 2016-02-10 | Disposition: A | Discharge status: Home / Self Care | Payer: BLUE CROSS/BLUE SHIELD | Source: Ambulatory Visit

## 2016-02-10 DIAGNOSIS — Z1231 Encounter for screening mammogram for malignant neoplasm of breast: Secondary | ICD-10-CM

## 2016-03-16 ENCOUNTER — Other Ambulatory Visit: Payer: Self-pay | Admitting: Gastroenterology

## 2016-03-25 IMAGING — CR DG CHEST 2V
2 series · 2 of 2 positions shown · non-contrast
Comparison: Prior chest x-ray 06/11/2014

CLINICAL DATA: 48-year-old female with chest pain radiating to the
left arm and sensation of heart racing

EXAM:
CHEST  2 VIEW

[w chest pa]
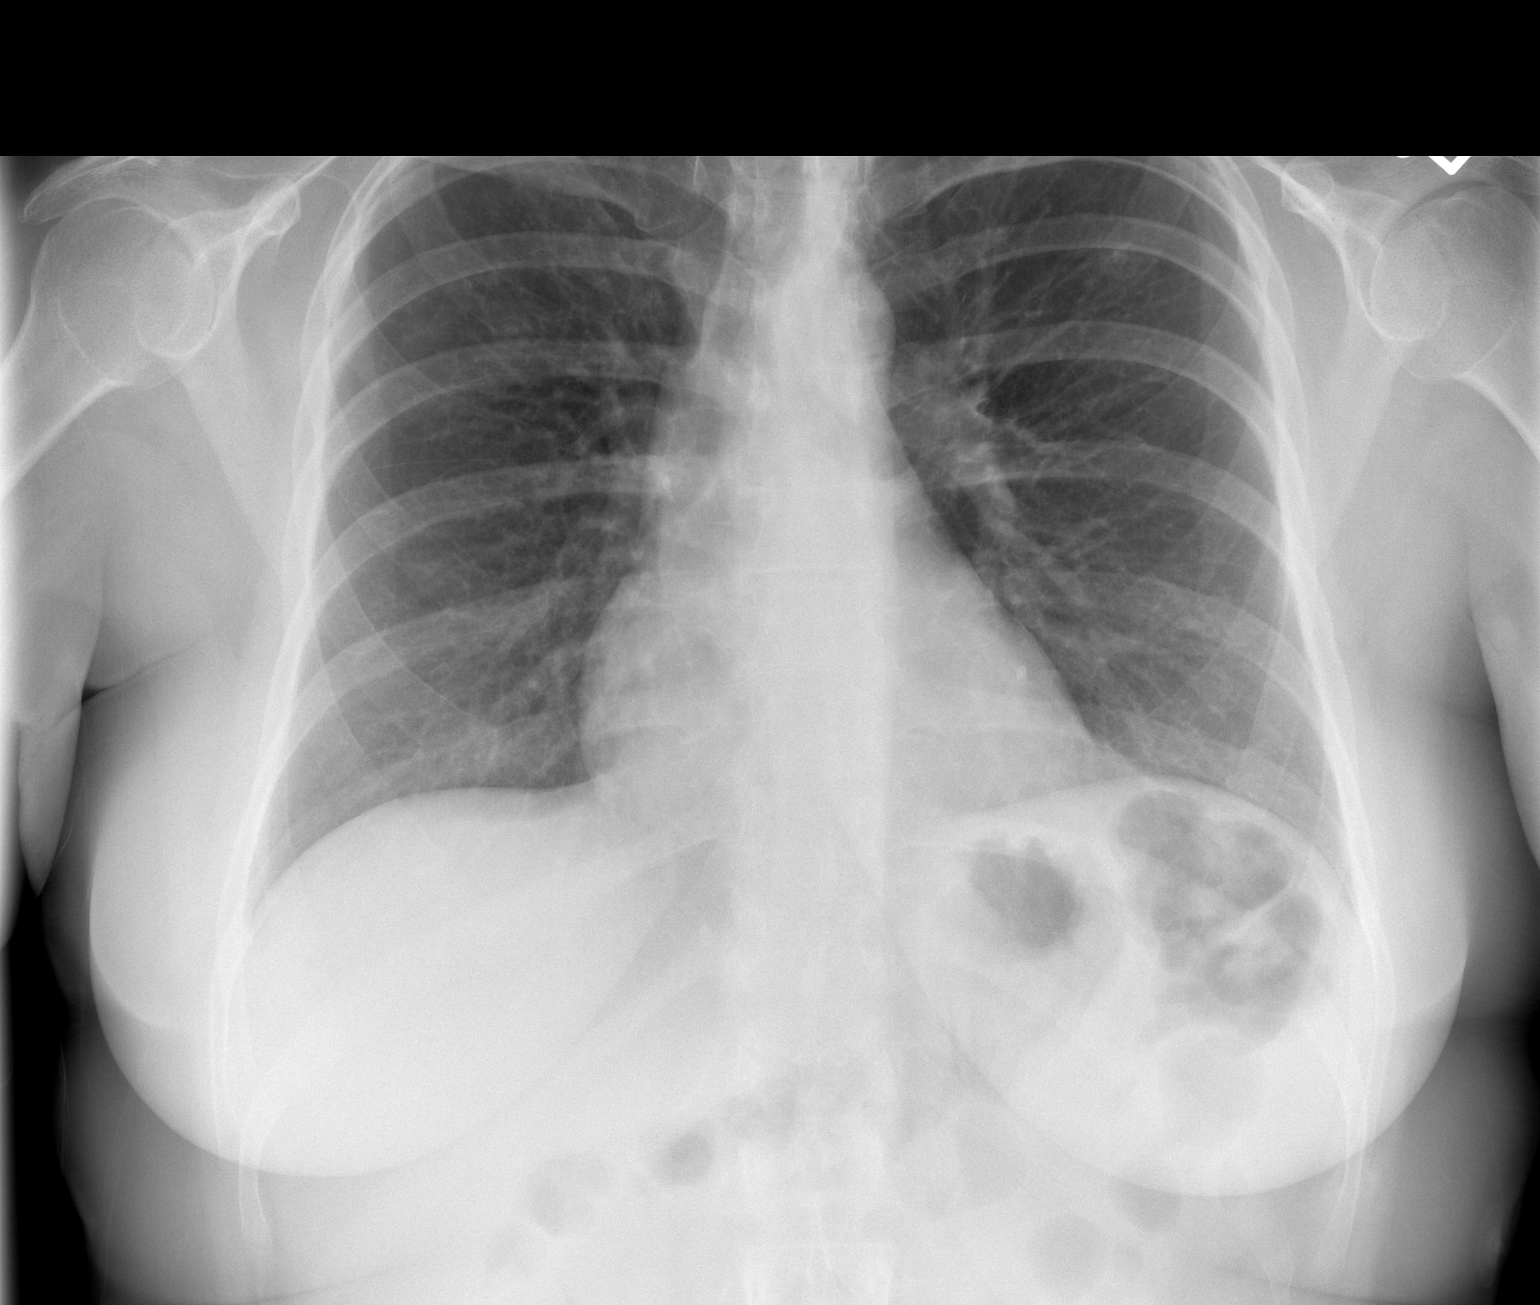

[w chest lat]
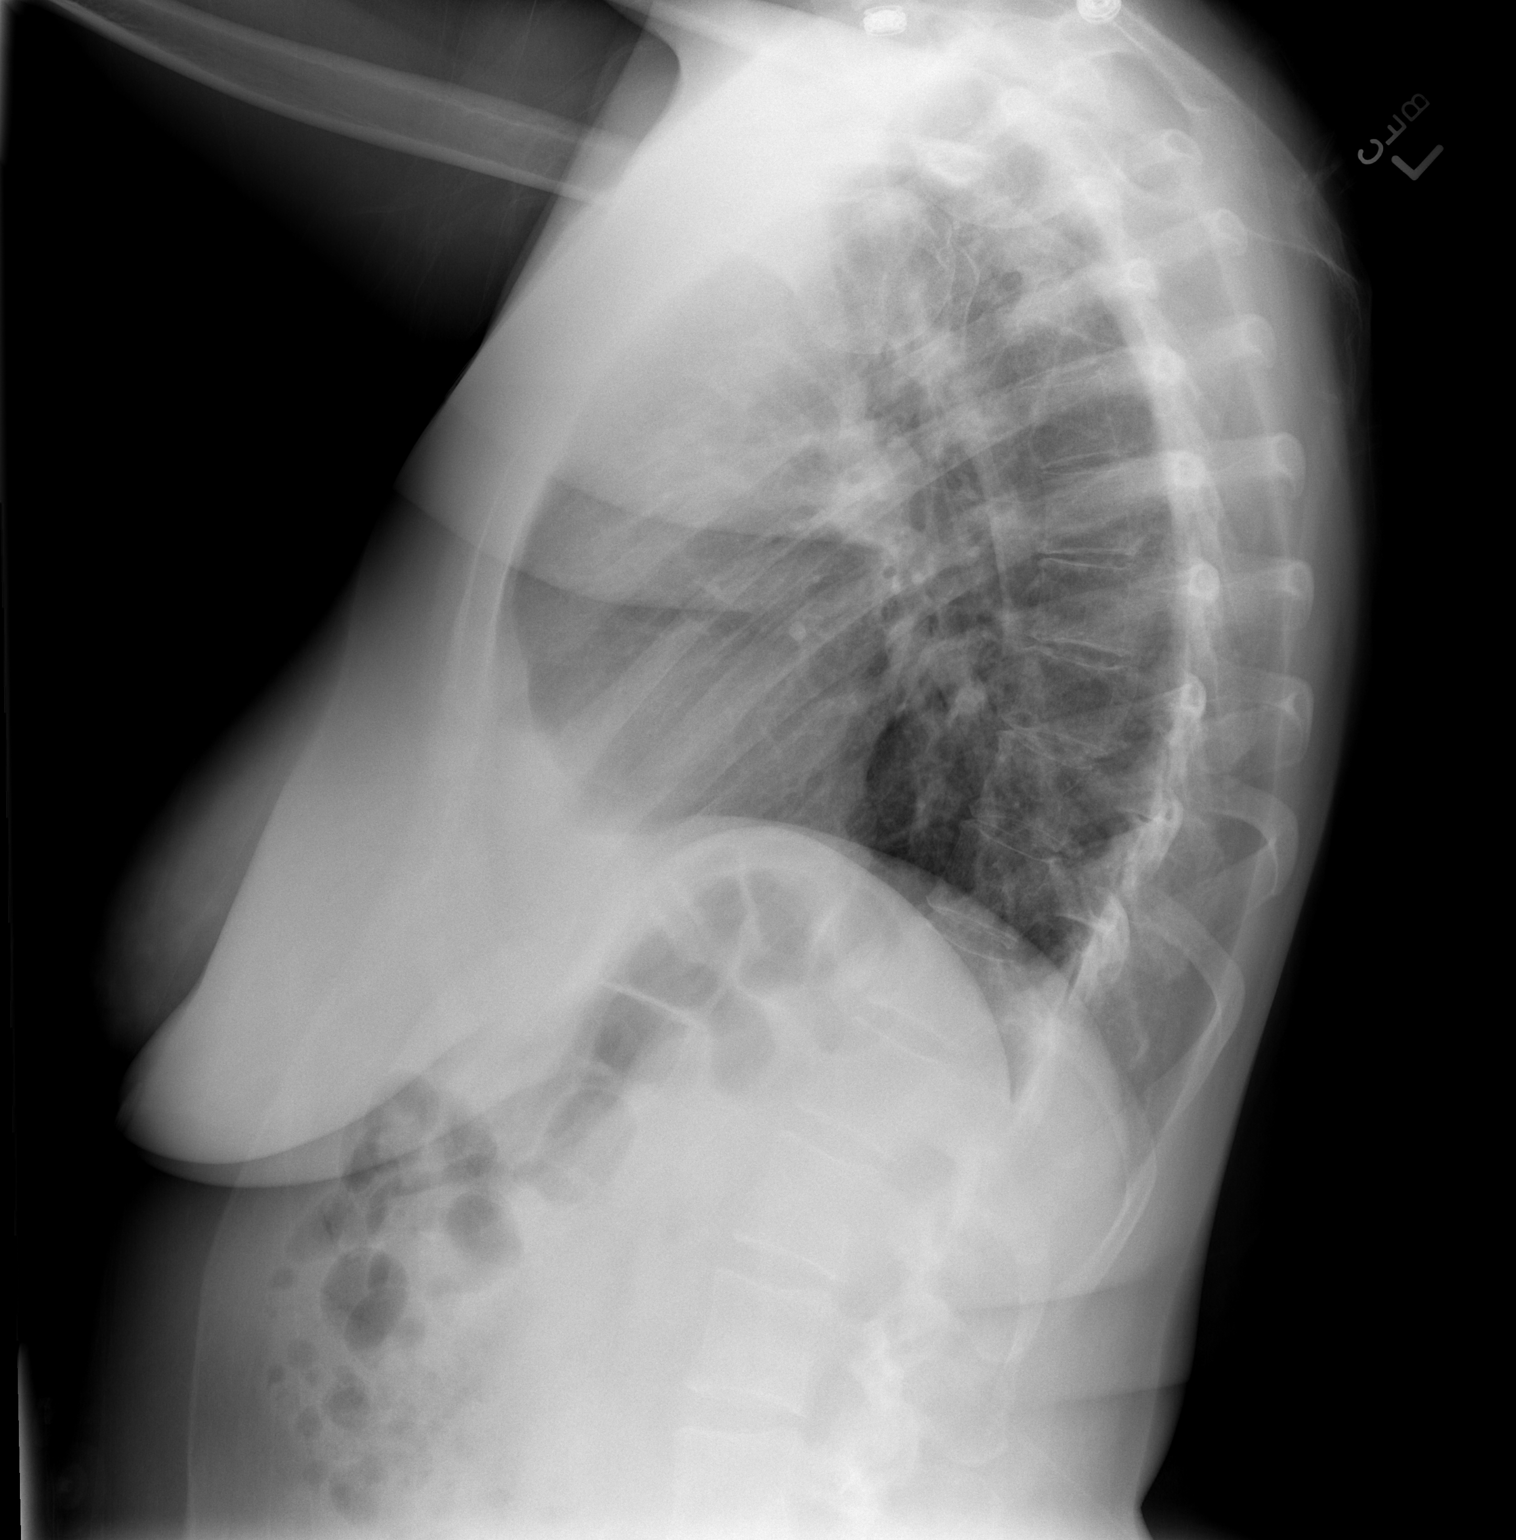

[2 of 2 positions shown; findings below may reference images not displayed]

FINDINGS: Stable cardiac and mediastinal contours which remain within normal
limits. No focal airspace consolidation, evidence of pulmonary
edema, pleural effusion or pneumothorax. No acute osseous
abnormality. Degenerative change at the right acromioclavicular
which.
IMPRESSION: No active cardiopulmonary disease.

## 2016-06-29 ENCOUNTER — Telehealth: Payer: Self-pay | Admitting: Gastroenterology

## 2016-06-29 ENCOUNTER — Other Ambulatory Visit: Payer: Self-pay | Admitting: Gastroenterology

## 2016-06-29 MED ORDER — PANTOPRAZOLE SODIUM 40 MG PO TBEC: 40.0000 mg | DELAYED_RELEASE_TABLET | Freq: Every day | ORAL | 0 refills | Status: DC

## 2016-06-29 NOTE — Telephone Encounter (Signed)
Prescription sent to patient's pharmacy and patient notified to keep appt for any further refills. Patient verbalized understanding. 

## 2016-08-25 ENCOUNTER — Ambulatory Visit (INDEPENDENT_AMBULATORY_CARE_PROVIDER_SITE_OTHER): Payer: BLUE CROSS/BLUE SHIELD | Admitting: Gastroenterology

## 2016-08-25 ENCOUNTER — Encounter: Payer: Self-pay | Admitting: Gastroenterology

## 2016-08-25 VITALS — BP 120/76 | HR 60 | Ht 63.0 in | Wt 202.0 lb

## 2016-08-25 DIAGNOSIS — K2960 Other gastritis without bleeding: Secondary | ICD-10-CM

## 2016-08-25 DIAGNOSIS — K2100 Gastro-esophageal reflux disease with esophagitis, without bleeding: Secondary | ICD-10-CM

## 2016-08-25 MED ORDER — RANITIDINE HCL 300 MG PO CAPS
300.0000 mg | ORAL_CAPSULE | Freq: Two times a day (BID) | ORAL | 11 refills | Status: DC
Start: 2016-08-25 — End: 2017-09-29

## 2016-08-25 NOTE — Progress Notes (Signed)
    History of Present Illness: This is a 50 year old female with a history of erosive esophagitis. She has reflux symptoms several times per week generally brought on by recumbency. Due to news reports of PPI side effects she now only takes her PPI once or twice a week when she has symptoms.   EGD ENDOSCOPIC IMPRESSION 01/2015: 1. LA Class B esophagitis 2. Erosive gastritis in the gastric body and gastric antrum; multiple biopsies performed 3. Small hiatal hernia 4. Duodenitis, nonerosive in the duodenal bulb  Colonoscopy ENDOSCOPIC IMPRESSION 05/2015: Normal colonoscopy  Current Medications, Allergies, Past Medical History, Past Surgical History, Family History and Social History were reviewed in [facility] electronic medical record.  Physical Exam: General: Well developed, well nourished, no acute distress Head: Normocephalic and atraumatic Eyes:  sclerae anicteric, EOMI Ears: Normal auditory acuity Mouth: No deformity or lesions Lungs: Clear throughout to auscultation Heart: Regular rate and rhythm; no murmurs, rubs or bruits Abdomen: Soft, non tender and non distended. No masses, hepatosplenomegaly or hernias noted. Normal Bowel sounds Musculoskeletal: Symmetrical with no gross deformities  Pulses:  Normal pulses noted Extremities: No clubbing, cyanosis, edema or deformities noted Neurological: Alert oriented x 4, grossly nonfocal Psychological:  Alert and cooperative. Normal mood and affect  Assessment and Recommendations:  1. GERD with erosive esophagitis, erosive gastritis and duodenitis. Follow standard antireflux measures. We had discussion about PPIs, their safety record and the poor quality of the studies that have shown problems with PPIs. Patient still wishes to avoid PPI. Begin ranitidine 300 mg twice daily. If symptoms are not adequately controlled she will reconsider PPI usage. I spent 15 minutes of face-to-face time with the patient. Greater than 50% of the time  was spent counseling and coordinating care.

## 2016-08-25 NOTE — Patient Instructions (Signed)
We have sent the following medications to your pharmacy for you to pick up at your convenience: ranitidine 300 mg twice daily.   Call back if your symptoms are no better.   Thank you for choosing me and [facility].  Venita LickMalcolm T. Pleas KochStark, Jr., MD., Clementeen GrahamFACG

## 2017-01-27 ENCOUNTER — Other Ambulatory Visit: Payer: Self-pay | Admitting: Family Medicine

## 2017-01-27 DIAGNOSIS — Z1231 Encounter for screening mammogram for malignant neoplasm of breast: Secondary | ICD-10-CM

## 2017-02-10 ENCOUNTER — Ambulatory Visit: Payer: BLUE CROSS/BLUE SHIELD

## 2017-02-11 ENCOUNTER — Ambulatory Visit
Admission: RE | Admit: 2017-02-11 | Discharge: 2017-02-11 | Disposition: A | Discharge status: Home / Self Care | Payer: BLUE CROSS/BLUE SHIELD | Source: Ambulatory Visit | Attending: Family Medicine | Admitting: Family Medicine

## 2017-02-11 DIAGNOSIS — Z1231 Encounter for screening mammogram for malignant neoplasm of breast: Secondary | ICD-10-CM

## 2017-09-29 ENCOUNTER — Other Ambulatory Visit: Payer: Self-pay | Admitting: Gastroenterology

## 2017-11-11 ENCOUNTER — Other Ambulatory Visit: Payer: Self-pay | Admitting: Gastroenterology

## 2017-12-03 ENCOUNTER — Other Ambulatory Visit (HOSPITAL_COMMUNITY): Payer: Self-pay | Admitting: Physician Assistant

## 2017-12-03 DIAGNOSIS — M25561 Pain in right knee: Secondary | ICD-10-CM

## 2017-12-07 ENCOUNTER — Ambulatory Visit (HOSPITAL_COMMUNITY): Payer: Commercial Managed Care - PPO

## 2018-03-03 ENCOUNTER — Ambulatory Visit (HOSPITAL_COMMUNITY): Payer: Commercial Managed Care - PPO | Attending: Internal Medicine

## 2018-03-03 ENCOUNTER — Other Ambulatory Visit (HOSPITAL_COMMUNITY): Payer: Self-pay | Admitting: Family Medicine

## 2018-03-03 ENCOUNTER — Ambulatory Visit: Payer: Commercial Managed Care - PPO | Admitting: Genetic Counselor

## 2018-03-03 ENCOUNTER — Other Ambulatory Visit: Payer: Self-pay

## 2018-03-03 DIAGNOSIS — I517 Cardiomegaly: Secondary | ICD-10-CM | POA: Insufficient documentation

## 2018-03-03 DIAGNOSIS — I421 Obstructive hypertrophic cardiomyopathy: Secondary | ICD-10-CM

## 2018-03-07 NOTE — Progress Notes (Signed)
Referral Reason  Delia ChimesMelissa Lister was referred for genetic testing of HCM due to the recent diagnosis of HCM in her mother, Brayton CavesCathy Mabe  Genetic Consultation Notes  Efraim KaufmannMelissa was counseled on the genetics of hypertrophic cardiomyopathy (HCM), namely its autosomal dominant inheritance.   Traditional Risk Factors for HCM Uilani denies having other cardiac or systemic condition capable of producing hypertrophy, namely hypertension, aortic valve stenosis or being a competitive athlete.    Family history Kristina (IV.11) is the elder sibling. Her mother (III.4) was recently diagnosed with HCM. She states that her mother had hypertension for the last 5-10 years and was taking medication to control it. She also suffered from COPD and emphysema for the last 26 years. She informs me that sometime this year, her mother stopped taking her blood pressure medication with subsequent elevation of her blood pressure necessitating a visit to her cardiologist. An MRI indicated wall thickness suggestive of HCM.  Family history relevant to HCM includes Rosilyn's uncle (III.2) who died from a heart attack. The family was reportedly told that his "heart busted open". His kids (IV.2-IV.6) are currently in good health. She also reports her maternal grandmother (II.6) being diagnosed with congestive heart failure at 5675, a maternal granduncle (II.8) dying of a heart attack and another maternal grand uncle (II.10) having an ICD implanted in his 5460s. Her mother's maternal grandmother (I.4) died of congestive heart failure at 3281.   Impression  I informed Arra that typically the most severely affected individual, in this case her mother, would be the appropriate person to undergo genetic testing. Lilliauna is scheduled to undergo an echocardiogram today. If this shows heart wall thickness indicative of HCM, then we can have her tested for HCM. She verbalized understanding of this.   Plan Tymesha will be scheduled for a genetic  consult of HCM and testing, if her echocardiogram shows wall thickness suggestive of HCM.  Sidney AceSumy Tylee Newby, Ph.D, [facility] Clinical Molecular Geneticist

## 2018-03-23 ENCOUNTER — Other Ambulatory Visit: Payer: Self-pay | Admitting: Family Medicine

## 2018-03-23 DIAGNOSIS — Z1231 Encounter for screening mammogram for malignant neoplasm of breast: Secondary | ICD-10-CM

## 2018-04-14 ENCOUNTER — Ambulatory Visit
Admission: RE | Admit: 2018-04-14 | Discharge: 2018-04-14 | Disposition: A | Discharge status: Home / Self Care | Payer: Commercial Managed Care - PPO | Source: Ambulatory Visit | Attending: Family Medicine | Admitting: Family Medicine

## 2018-04-14 DIAGNOSIS — Z1231 Encounter for screening mammogram for malignant neoplasm of breast: Secondary | ICD-10-CM

## 2019-05-30 ENCOUNTER — Other Ambulatory Visit: Payer: Self-pay | Admitting: Family Medicine

## 2019-05-30 DIAGNOSIS — Z1231 Encounter for screening mammogram for malignant neoplasm of breast: Secondary | ICD-10-CM

## 2019-06-02 ENCOUNTER — Ambulatory Visit: Payer: Commercial Managed Care - PPO

## 2019-07-13 ENCOUNTER — Other Ambulatory Visit: Payer: Self-pay

## 2019-07-13 ENCOUNTER — Ambulatory Visit: Payer: Commercial Managed Care - PPO

## 2019-07-13 ENCOUNTER — Ambulatory Visit
Admission: RE | Admit: 2019-07-13 | Discharge: 2019-07-13 | Disposition: A | Discharge status: Home / Self Care | Payer: Commercial Managed Care - PPO | Source: Ambulatory Visit | Attending: Family Medicine | Admitting: Family Medicine

## 2019-07-13 DIAGNOSIS — Z1231 Encounter for screening mammogram for malignant neoplasm of breast: Secondary | ICD-10-CM

## 2020-09-30 ENCOUNTER — Other Ambulatory Visit: Payer: Self-pay | Admitting: Family Medicine

## 2020-09-30 DIAGNOSIS — Z1231 Encounter for screening mammogram for malignant neoplasm of breast: Secondary | ICD-10-CM

## 2020-11-12 ENCOUNTER — Ambulatory Visit: Payer: Commercial Managed Care - PPO

## 2020-11-19 ENCOUNTER — Inpatient Hospital Stay: Admission: RE | Admit: 2020-11-19 | Payer: Commercial Managed Care - PPO | Source: Ambulatory Visit

## 2021-01-10 ENCOUNTER — Other Ambulatory Visit (HOSPITAL_COMMUNITY): Payer: Self-pay | Admitting: Physician Assistant

## 2021-01-10 ENCOUNTER — Other Ambulatory Visit: Payer: Self-pay | Admitting: Physician Assistant

## 2021-01-10 DIAGNOSIS — M7989 Other specified soft tissue disorders: Secondary | ICD-10-CM

## 2021-01-17 ENCOUNTER — Ambulatory Visit (HOSPITAL_COMMUNITY): Payer: Commercial Managed Care - PPO

## 2021-01-20 ENCOUNTER — Other Ambulatory Visit: Payer: Self-pay | Admitting: Family Medicine

## 2021-01-20 ENCOUNTER — Other Ambulatory Visit: Payer: Self-pay

## 2021-01-20 ENCOUNTER — Ambulatory Visit
Admission: RE | Admit: 2021-01-20 | Discharge: 2021-01-20 | Disposition: A | Discharge status: Home / Self Care | Payer: Commercial Managed Care - PPO | Source: Ambulatory Visit | Attending: Family Medicine | Admitting: Family Medicine

## 2021-01-20 DIAGNOSIS — Z1231 Encounter for screening mammogram for malignant neoplasm of breast: Secondary | ICD-10-CM

## 2021-01-20 DIAGNOSIS — R928 Other abnormal and inconclusive findings on diagnostic imaging of breast: Secondary | ICD-10-CM

## 2021-01-23 ENCOUNTER — Other Ambulatory Visit: Payer: Self-pay

## 2021-01-23 ENCOUNTER — Ambulatory Visit (HOSPITAL_COMMUNITY)
Admission: RE | Admit: 2021-01-23 | Discharge: 2021-01-23 | Disposition: A | Discharge status: Home / Self Care | Payer: Commercial Managed Care - PPO | Source: Ambulatory Visit | Attending: Physician Assistant | Admitting: Physician Assistant

## 2021-01-23 ENCOUNTER — Other Ambulatory Visit: Payer: Self-pay | Admitting: Family Medicine

## 2021-01-23 ENCOUNTER — Ambulatory Visit
Admission: RE | Admit: 2021-01-23 | Discharge: 2021-01-23 | Disposition: A | Discharge status: Home / Self Care | Payer: Commercial Managed Care - PPO | Source: Ambulatory Visit | Attending: Family Medicine | Admitting: Family Medicine

## 2021-01-23 DIAGNOSIS — R921 Mammographic calcification found on diagnostic imaging of breast: Secondary | ICD-10-CM

## 2021-01-23 DIAGNOSIS — M7989 Other specified soft tissue disorders: Secondary | ICD-10-CM | POA: Diagnosis not present

## 2021-01-23 DIAGNOSIS — R928 Other abnormal and inconclusive findings on diagnostic imaging of breast: Secondary | ICD-10-CM

## 2021-01-24 ENCOUNTER — Other Ambulatory Visit (HOSPITAL_COMMUNITY): Payer: Self-pay | Admitting: Family Medicine

## 2021-01-24 DIAGNOSIS — I82412 Acute embolism and thrombosis of left femoral vein: Secondary | ICD-10-CM

## 2021-01-29 ENCOUNTER — Ambulatory Visit
Admission: RE | Admit: 2021-01-29 | Discharge: 2021-01-29 | Disposition: A | Discharge status: Home / Self Care | Payer: Commercial Managed Care - PPO | Source: Ambulatory Visit | Attending: Family Medicine | Admitting: Family Medicine

## 2021-01-29 ENCOUNTER — Other Ambulatory Visit: Payer: Self-pay | Admitting: Family Medicine

## 2021-01-29 ENCOUNTER — Other Ambulatory Visit: Payer: Self-pay

## 2021-01-29 DIAGNOSIS — R921 Mammographic calcification found on diagnostic imaging of breast: Secondary | ICD-10-CM

## 2021-01-29 HISTORY — PX: BREAST BIOPSY: SHX20

## 2021-03-05 ENCOUNTER — Other Ambulatory Visit (HOSPITAL_COMMUNITY): Payer: Self-pay | Admitting: Family Medicine

## 2021-03-05 DIAGNOSIS — I824Y3 Acute embolism and thrombosis of unspecified deep veins of proximal lower extremity, bilateral: Secondary | ICD-10-CM

## 2021-03-06 ENCOUNTER — Other Ambulatory Visit: Payer: Self-pay

## 2021-03-06 ENCOUNTER — Ambulatory Visit (HOSPITAL_COMMUNITY)
Admission: RE | Admit: 2021-03-06 | Discharge: 2021-03-06 | Disposition: A | Discharge status: Home / Self Care | Payer: Commercial Managed Care - PPO | Source: Ambulatory Visit | Attending: Family Medicine | Admitting: Family Medicine

## 2021-03-06 DIAGNOSIS — I824Y3 Acute embolism and thrombosis of unspecified deep veins of proximal lower extremity, bilateral: Secondary | ICD-10-CM | POA: Insufficient documentation

## 2022-01-28 ENCOUNTER — Other Ambulatory Visit: Payer: Self-pay | Admitting: Family Medicine

## 2022-01-28 DIAGNOSIS — Z1231 Encounter for screening mammogram for malignant neoplasm of breast: Secondary | ICD-10-CM

## 2022-02-10 ENCOUNTER — Ambulatory Visit
Admission: RE | Admit: 2022-02-10 | Discharge: 2022-02-10 | Disposition: A | Discharge status: Home / Self Care | Payer: Commercial Managed Care - PPO | Source: Ambulatory Visit | Attending: Family Medicine | Admitting: Family Medicine

## 2022-02-10 DIAGNOSIS — Z1231 Encounter for screening mammogram for malignant neoplasm of breast: Secondary | ICD-10-CM

## 2022-10-04 IMAGING — MG DIGITAL DIAGNOSTIC UNILAT LEFT W/ CAD
4 series · 4 of 4 positions shown · non-contrast
Comparison: Previous exam(s).

CLINICAL DATA: Calcifications in the outer left breast on a recent
screening mammogram.

EXAM:
DIGITAL DIAGNOSTIC UNILATERAL LEFT MAMMOGRAM WITH CAD
TECHNIQUE: Left digital diagnostic mammography was performed. Mammographic
images were processed with CAD.

[L ML (1 of 3)]
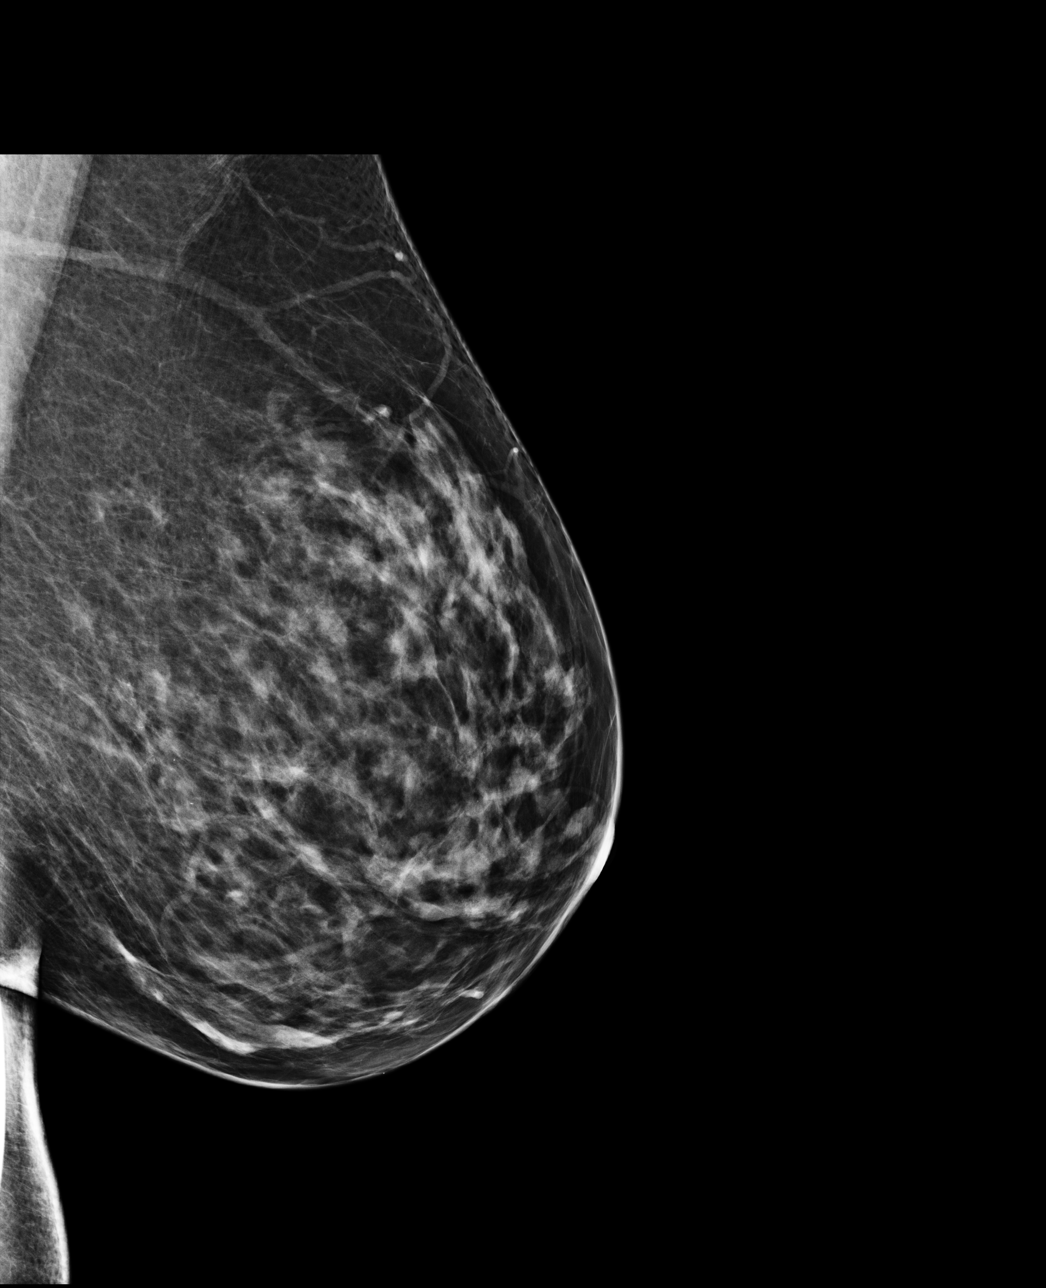

[L ML (2 of 3)]
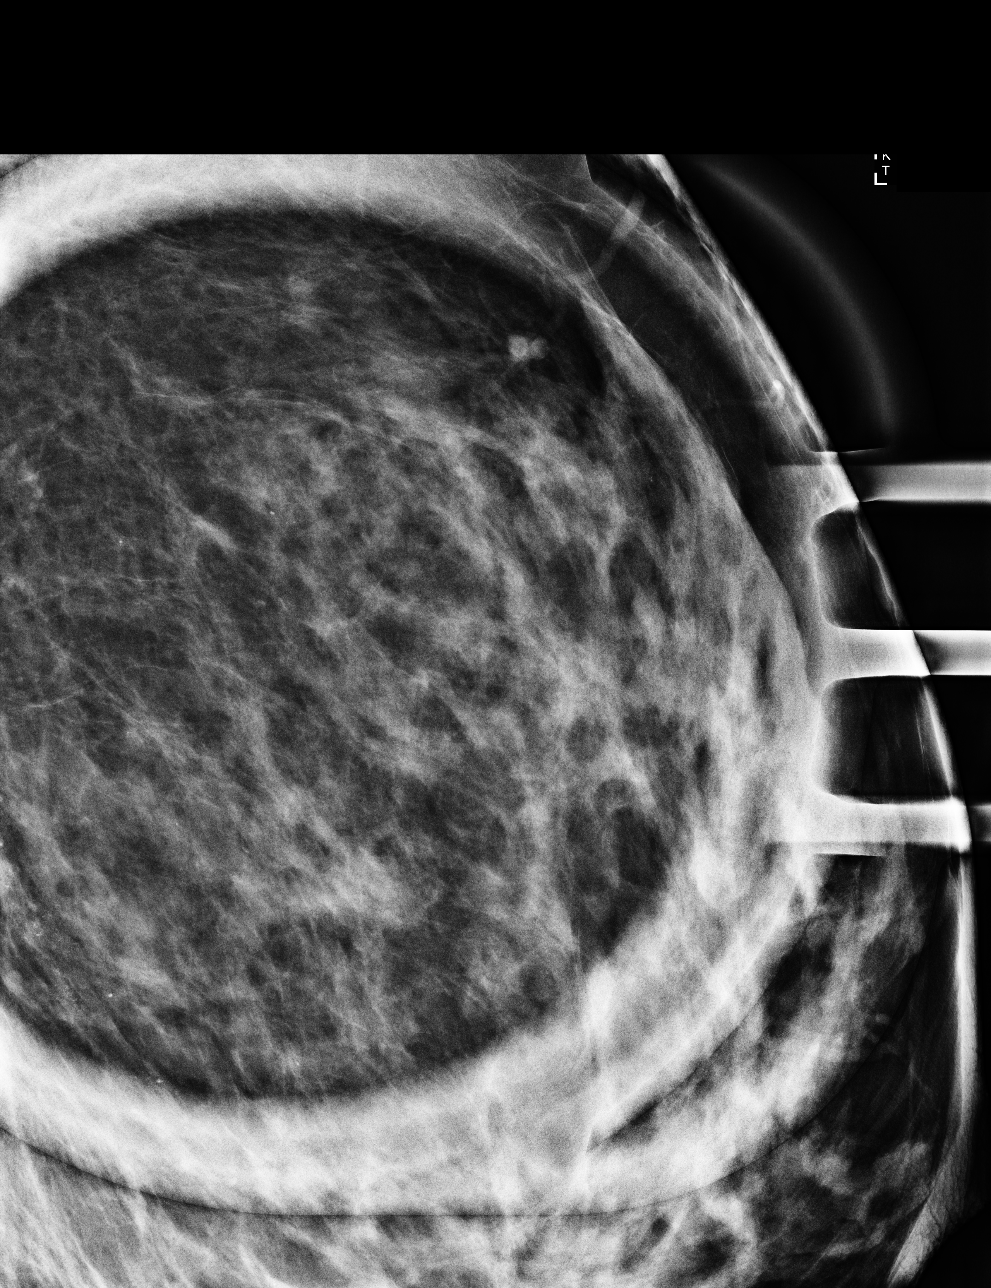

[L ML (3 of 3)]
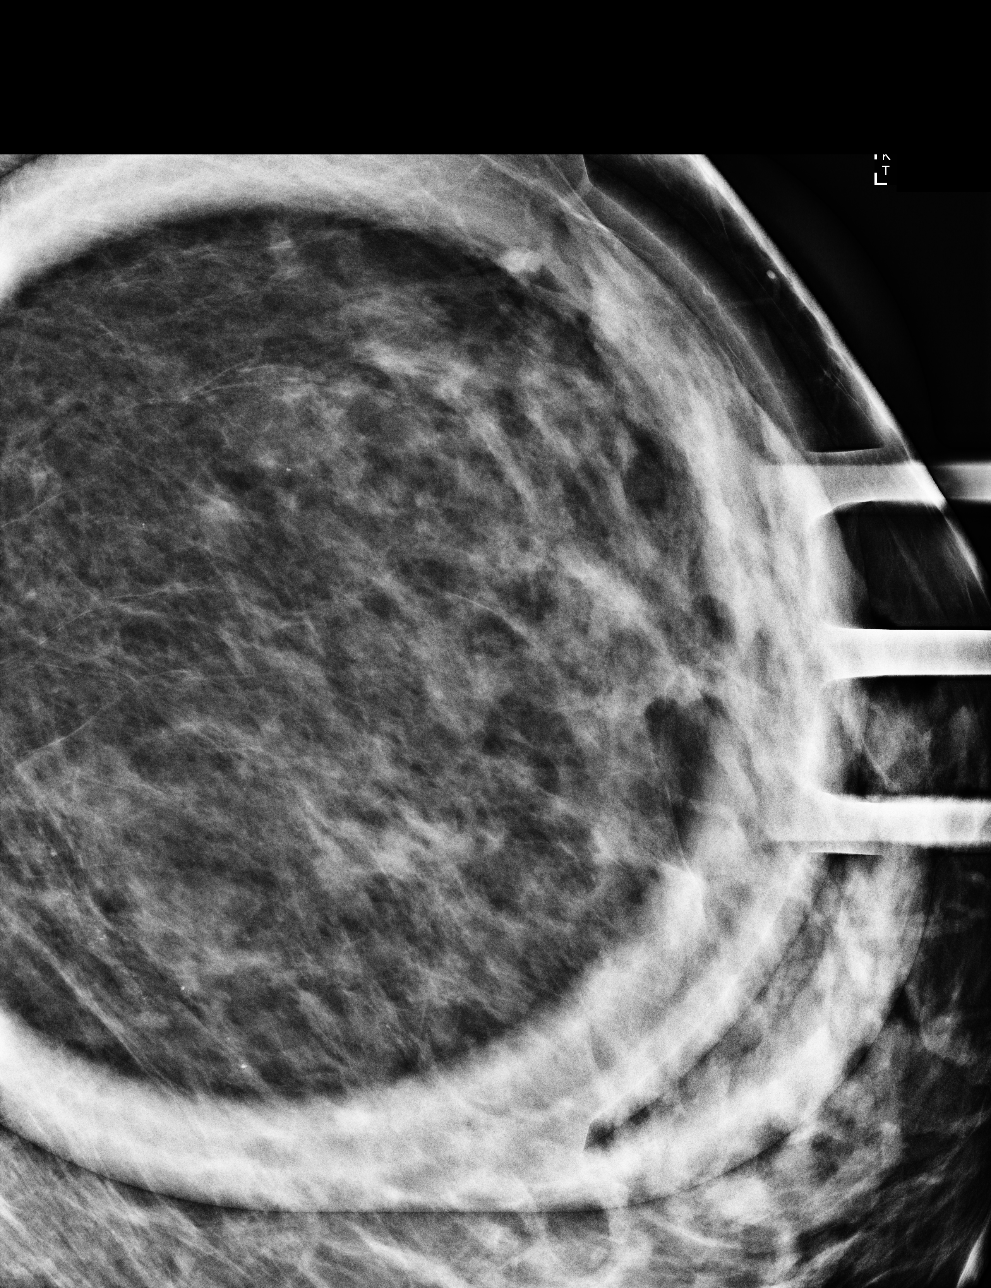

[L CC]
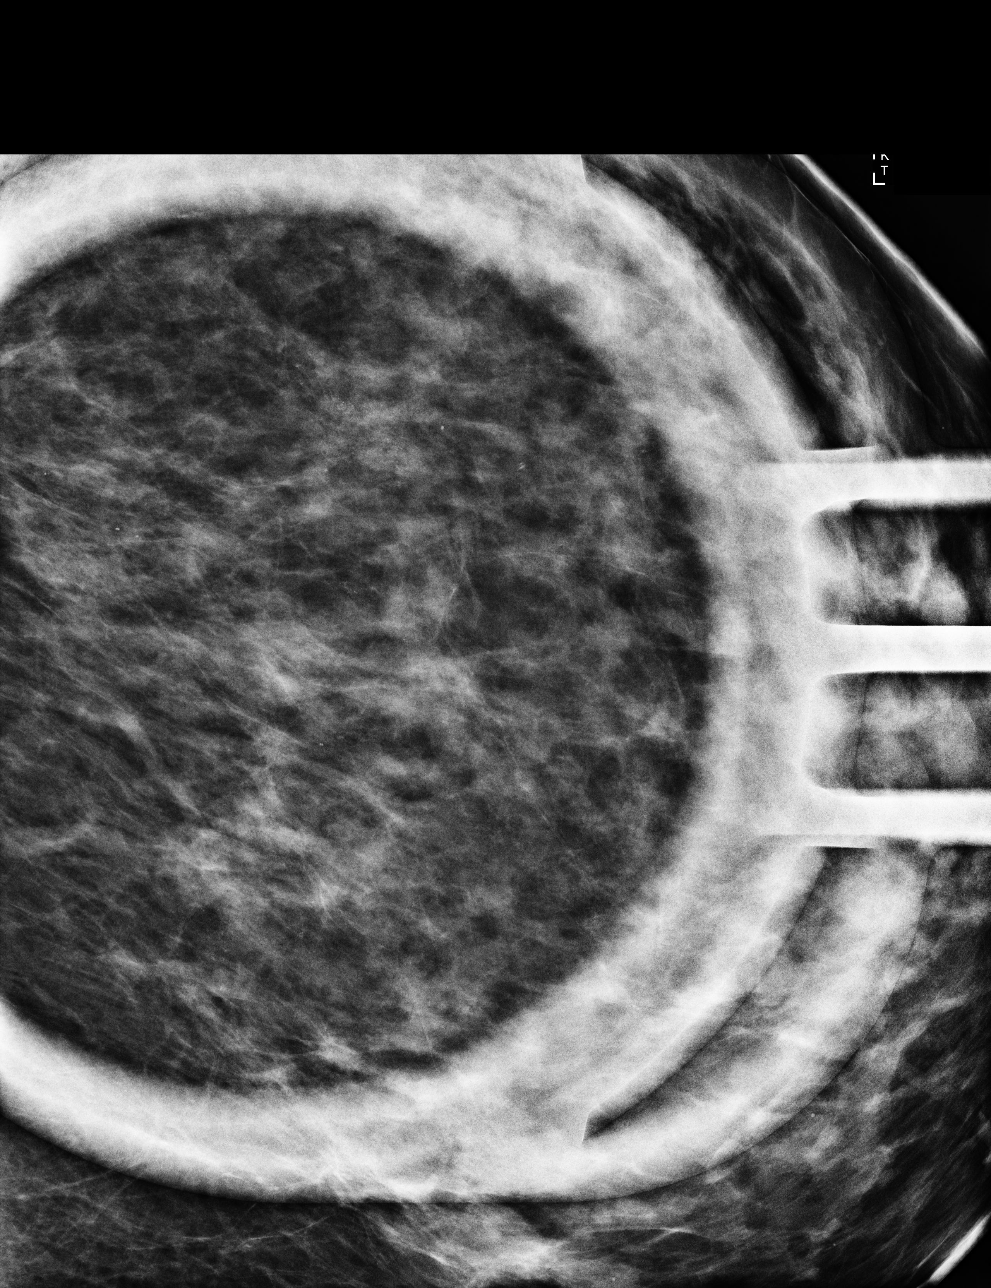

[4 of 4 positions shown; findings below may reference images not displayed]

ACR Breast Density Category c: The breast tissue is heterogeneously
dense, which may obscure small masses.
FINDINGS: 2D true lateral and spot magnification images of the left breast
demonstrate a 2.6 x 0.8 x 0.6 cm group of a large number of tiny,
faint, amorphous calcifications in the posterior outer, slightly
lower left breast.
IMPRESSION: 2.6 cm group of indeterminate calcifications in the posterior lower
outer left breast.

RECOMMENDATION:
3D stereotactic guided core needle biopsy of the 2.6 cm group of
calcifications in the lower outer left breast. This has been
discussed with the patient and scheduled at [DATE] a.m. on 01/29/2021.

I have discussed the findings and recommendations with the patient.
If applicable, a reminder letter will be sent to the patient
regarding the next appointment.

BI-RADS CATEGORY  4: Suspicious.

## 2022-12-14 ENCOUNTER — Other Ambulatory Visit (HOSPITAL_COMMUNITY): Payer: Self-pay | Admitting: Physician Assistant

## 2022-12-14 DIAGNOSIS — I509 Heart failure, unspecified: Secondary | ICD-10-CM

## 2022-12-17 ENCOUNTER — Encounter: Payer: Self-pay | Admitting: Internal Medicine

## 2022-12-17 NOTE — Telephone Encounter (Signed)
error 

## 2023-01-13 ENCOUNTER — Ambulatory Visit (HOSPITAL_COMMUNITY): Payer: Commercial Managed Care - PPO

## 2023-02-03 ENCOUNTER — Telehealth: Payer: Self-pay | Admitting: Internal Medicine

## 2023-02-03 NOTE — Telephone Encounter (Signed)
Received PCP labs: - LDL 138 on current therapy No other high risk findings on fax.  Patient has appointment 02/11/23.

## 2023-02-04 ENCOUNTER — Other Ambulatory Visit: Payer: Self-pay | Admitting: Family Medicine

## 2023-02-04 DIAGNOSIS — Z0000 Encounter for general adult medical examination without abnormal findings: Secondary | ICD-10-CM

## 2023-02-08 ENCOUNTER — Ambulatory Visit (HOSPITAL_COMMUNITY): Payer: Commercial Managed Care - PPO | Attending: Physician Assistant

## 2023-02-08 DIAGNOSIS — I509 Heart failure, unspecified: Secondary | ICD-10-CM

## 2023-02-08 LAB — ECHOCARDIOGRAM COMPLETE
Area-P 1/2: 3.85 cm2
Calc EF: 60.2 %
S' Lateral: 3.1 cm
Single Plane A2C EF: 62.1 %
Single Plane A4C EF: 58.1 %

## 2023-02-10 NOTE — Progress Notes (Unsigned)
Cardiology Office Note:    Date:  02/11/2023   ID:  Jillian Walker, DOB 08/16/1966, MRN [id number]  PCP:  Richardean Chimera, MD   [facility] Providers Cardiologist:  Christell Constant, MD     Referring MD: Richardean Chimera, MD   CC: Fhx of HCM  Consulted for the evaluation of family history of oHCM at the behest of Dr. Reuel Boom  History of Present Illness:    Jillian Walker is a 57 y.o. female with a hx of family history of oHCM (mother).   Echo was performed 02/08/2023 without evidence of HCM (maximal thickness 1 cm).  Patient notes that she is feeling well. I did not do genetic testing when seeing [name] in 2019 (will confirm) His   Has had no chest pain, chest pressure, chest tightness, chest stinging . She had elevated LDL.  She is seeing PCP in July. She is going to start back at [employer].  Her husband has had notable health issues and this has caused her to decrease activity.  No shortness of breath, DOE .  No PND or orthopnea.  No weight gain, leg swelling , or abdominal swelling.  No syncope or near syncope .  Notes nocturnal palpitations that are intermittent.  Patient reports prior cardiac testing including echo prior to her 2024 study.  Ambulatory BP 120/80  but with rare nocturnal hypertension.  Log reviewed.   Past Medical History:  Diagnosis Date   Anemia    Cough    GERD (gastroesophageal reflux disease)    SVD (spontaneous vaginal delivery)    x 2   Vitamin D deficiency     Past Surgical History:  Procedure Laterality Date   TUBAL LIGATION  1993   UPPER GASTROINTESTINAL ENDOSCOPY  01/2015    Current Medications: Current Meds  Medication Sig   B Complex Vitamins (VITAMIN B COMPLEX PO) Take 1 capsule by mouth daily.   calcium carbonate (OS-CAL) 600 MG TABS tablet Take 600 mg by mouth daily with breakfast.   Ergocalciferol (VITAMIN D2) 2000 UNITS TABS Take 1 capsule by mouth daily.   famotidine (PEPCID) 10 MG tablet Take by  mouth as needed for heartburn.   Multiple Vitamins-Minerals (WOMENS 50+ MULTI VITAMIN PO) Take by mouth daily at 6 (six) AM.     Allergies:   Patient has no known allergies.   Social History   Socioeconomic History   Marital status: Married    Spouse name: Not on file   Number of children: 2   Years of education: Not on file   Highest education level: Not on file  Occupational History   Occupation: RETIRED  Tobacco Use   Smoking status: Never   Smokeless tobacco: Never  Substance and Sexual Activity   Alcohol use: No    Alcohol/week: 0.0 standard drinks of alcohol   Drug use: No   Sexual activity: Not on file  Other Topics Concern   Not on file  Social History Narrative   Not on file   Social Determinants of Health   Financial Resource Strain: Not on file  Food Insecurity: Not on file  Transportation Needs: Not on file  Physical Activity: Not on file  Stress: Not on file  Social Connections: Not on file     Family History: The patient's family history includes Cancer in her father; Colon cancer in an other family member; Hypertension in her mother. There is no history of Colon polyps.   EKGs/Labs/Other Studies  Reviewed:    The following studies were reviewed today:   EKG:  EKG is  ordered today.  The ekg ordered today demonstrates  02/11/2023: Sinus bradycardia rate 55 with LVH by EKG criteria  Cardiac Studies & Procedures       ECHOCARDIOGRAM  ECHOCARDIOGRAM COMPLETE 02/08/2023  Narrative ECHOCARDIOGRAM REPORT    Patient Name:   Jillian Walker Date of Exam: 02/08/2023 Medical Rec #:  [id number]       Height:       63.0 in Accession #:    [id number]      Weight:       202.0 lb Date of Birth:  1966/04/12        BSA:          1.942 m Patient Age:    56 years        BP:           147/96 mmHg Patient Gender: F               HR:           54 bpm. Exam Location:  [address]  Procedure: 2D Echo, Cardiac Doppler, Color Doppler and Strain  Analysis  Indications:    Heart failure, unspecified (HCC) [428.9.ICD-9-CM]  History:        Patient has prior history of Echocardiogram examinations, most recent 03/03/2018. Risk Factors:GERD.  Sonographer:    Eulah Pont RDCS Referring Phys: [id number] Royann Shivers   Sonographer Comments: Global longitudinal strain was attempted. IMPRESSIONS   1. Left ventricular ejection fraction, by estimation, is 55 to 60%. The left ventricle has normal function. The left ventricle has no regional wall motion abnormalities. There is mild asymmetric left ventricular hypertrophy of the basal-septal segment. Left ventricular diastolic parameters were normal. 2. Right ventricular systolic function is normal. The right ventricular size is normal. There is normal pulmonary artery systolic pressure. The estimated right ventricular systolic pressure is 22.9 mmHg. 3. The mitral valve is normal in structure. Trivial mitral valve regurgitation. No evidence of mitral stenosis. 4. The aortic valve is tricuspid. Aortic valve regurgitation is not visualized. No aortic stenosis is present. 5. The inferior vena cava is normal in size with greater than 50% respiratory variability, suggesting right atrial pressure of 3 mmHg.  FINDINGS Left Ventricle: Left ventricular ejection fraction, by estimation, is 55 to 60%. The left ventricle has normal function. The left ventricle has no regional wall motion abnormalities. The left ventricular internal cavity size was normal in size. There is mild asymmetric left ventricular hypertrophy of the basal-septal segment. Left ventricular diastolic parameters were normal.  Right Ventricle: The right ventricular size is normal. No increase in right ventricular wall thickness. Right ventricular systolic function is normal. There is normal pulmonary artery systolic pressure. The tricuspid regurgitant velocity is 2.23 m/s, and with an assumed right atrial pressure of 3 mmHg, the  estimated right ventricular systolic pressure is 22.9 mmHg.  Left Atrium: Left atrial size was normal in size.  Right Atrium: Right atrial size was normal in size.  Pericardium: There is no evidence of pericardial effusion.  Mitral Valve: The mitral valve is normal in structure. Trivial mitral valve regurgitation. No evidence of mitral valve stenosis.  Tricuspid Valve: The tricuspid valve is normal in structure. Tricuspid valve regurgitation is trivial.  Aortic Valve: The aortic valve is tricuspid. Aortic valve regurgitation is not visualized. No aortic stenosis is present.  Pulmonic Valve: The pulmonic valve was not well visualized. Pulmonic valve  regurgitation is trivial.  Aorta: The aortic root and ascending aorta are structurally normal, with no evidence of dilitation.  Venous: The inferior vena cava is normal in size with greater than 50% respiratory variability, suggesting right atrial pressure of 3 mmHg.  IAS/Shunts: The interatrial septum was not well visualized.   LEFT VENTRICLE PLAX 2D LVIDd:         4.50 cm     Diastology LVIDs:         3.10 cm     LV e' medial:    12.20 cm/s LV PW:         0.90 cm     LV E/e' medial:  7.2 LV IVS:        0.80 cm     LV e' lateral:   10.50 cm/s LVOT diam:     2.10 cm     LV E/e' lateral: 8.3 LV SV:         66 LV SV Index:   34 LVOT Area:     3.46 cm  LV Volumes (MOD) LV vol d, MOD A2C: 89.8 ml LV vol d, MOD A4C: 74.3 ml LV vol s, MOD A2C: 34.0 ml LV vol s, MOD A4C: 31.1 ml LV SV MOD A2C:     55.8 ml LV SV MOD A4C:     74.3 ml LV SV MOD BP:      51.1 ml  RIGHT VENTRICLE RV S prime:     11.30 cm/s TAPSE (M-mode): 2.5 cm  LEFT ATRIUM             Index        RIGHT ATRIUM           Index LA diam:        3.90 cm 2.01 cm/m   RA Area:     14.20 cm LA Vol (A2C):   43.1 ml 22.20 ml/m  RA Volume:   31.60 ml  16.27 ml/m LA Vol (A4C):   46.9 ml 24.15 ml/m LA Biplane Vol: 45.3 ml 23.33 ml/m AORTIC VALVE LVOT Vmax:   89.20  cm/s LVOT Vmean:  50.700 cm/s LVOT VTI:    0.190 m  AORTA Ao Root diam: 3.00 cm Ao Asc diam:  3.10 cm  MITRAL VALVE               TRICUSPID VALVE MV Area (PHT): 3.85 cm    TR Peak grad:   19.9 mmHg MV Decel Time: 197 msec    TR Vmax:        223.00 cm/s MV E velocity: 87.50 cm/s MV A velocity: 73.40 cm/s  SHUNTS MV E/A ratio:  1.19        Systemic VTI:  0.19 m Systemic Diam: 2.10 cm  Epifanio Lesches MD Electronically signed by Epifanio Lesches MD Signature Date/Time: 02/08/2023/2:24:31 PM    Final              Recent Labs: No results found for requested labs within last 365 days.  Recent Lipid Panel No results found for: "CHOL", "TRIG", "HDL", "CHOLHDL", "VLDL", "LDLCALC", "LDLDIRECT"   Physical Exam:    VS:  BP 130/80   Pulse (!) 55   Ht 5' 2.5" (1.588 m)   Wt 197 lb (89.4 kg)   SpO2 97%   BMI 35.46 kg/m     Wt Readings from Last 3 Encounters:  02/11/23 197 lb (89.4 kg)  08/25/16 202 lb (91.6 kg)  05/29/15 197 lb (89.4 kg)  GEN:  Well nourished, well developed in no acute distress HEENT: Normal NECK: No JVD CARDIAC: RRR, no murmurs, rubs, gallops RESPIRATORY:  Clear to auscultation without rales, wheezing or rhonchi  ABDOMEN: Soft, non-tender, non-distended MUSCULOSKELETAL:  No edema; No deformity  SKIN: Warm and dry NEUROLOGIC:  Alert and oriented x 3 PSYCHIATRIC:  Normal affect   ASSESSMENT:    1. Family history of hypertrophic cardiomyopathy   2. Palpitations   3. Sinus bradycardia   4. Essential hypertension   5. Mixed hyperlipidemia    PLAN:    Family history of cardiomyopathy - discussed both her diagnosis, that of her aunt, and her daughter - will clarify that genetic testing was ordered in 2019; because both her husband and mom had HCM, and husband will not pursue genetic testing, her daughter will get echo here, her aunt has AF and will get evaluated only as needed for AF in [place] as part of her non HCM care (she is 59) -  septal thickness 1 cm; will get echo in 5 years.  At this visit, her husband notes that he had recent cardiac syncope;  we will work on getting a heart monitor (see his charts documentation)  Palpitations Sinus bradycardia Nocturnal hypertension - 14 non live monitor - may add low does amlodipine if monitor is negative  HLD - discussed CAC score; they will think about this  - has an exercise plan and will contemplate diet plan  One year for non HCM care; then 5 years for imaging       Medication Adjustments/Labs and Tests Ordered: Current medicines are reviewed at length with the patient today.  Concerns regarding medicines are outlined above.  Orders Placed This Encounter  Procedures   LONG TERM MONITOR (3-14 DAYS)   EKG 12-Lead   No orders of the defined types were placed in this encounter.   Patient Instructions  Medication Instructions:  Your physician recommends that you continue on your current medications as directed. Please refer to the Current Medication list given to you today.  *If you need a refill on your cardiac medications before your next appointment, please call your pharmacy*   Lab Work: NONE  If you have labs (blood work) drawn today and your tests are completely normal, you will receive your results only by: MyChart Message (if you have MyChart) OR A paper copy in the mail If you have any lab test that is abnormal or we need to change your treatment, we will call you to review the results.   Testing/Procedures: Your physician has requested that you wear a heart monitor.   IN 5 YEARS: Your physician has requested that you have an echocardiogram. Echocardiography is a painless test that uses sound waves to create images of your heart. It provides your doctor with information about the size and shape of your heart and how well your heart's chambers and valves are working. This procedure takes approximately one hour. There are no restrictions for this  procedure. Please do NOT wear cologne, perfume, aftershave, or lotions (deodorant is allowed). Please arrive 15 minutes prior to your appointment time.    Follow-Up: At [facility], you and your health needs are our priority.  As part of our continuing mission to provide you with exceptional heart care, we have created designated Provider Care Teams.  These Care Teams include your primary Cardiologist (physician) and Advanced Practice Providers (APPs -  Physician Assistants and Nurse Practitioners) who all work together to provide you with  the care you need, when you need it.  Your next appointment:   1 year(s)  Provider:   Christell Constant, MD     Other Instructions Christena Deem- Long Term Monitor Instructions  Your physician has requested you wear a ZIO patch monitor for 14 days.  This is a single patch monitor. [employer] supplies one patch monitor per enrollment. Additional stickers are not available. Please do not apply patch if you will be having a Nuclear Stress Test,   Cardiac CT, MRI, or Chest Xray during the period you would be wearing the  monitor. The patch cannot be worn during these tests. You cannot remove and re-apply the  ZIO XT patch monitor.  Your ZIO patch monitor will be mailed 3 day USPS to your address on file. It may take 3-5 days  to receive your monitor after you have been enrolled.  Once you have received your monitor, please review the enclosed instructions. Your monitor  has already been registered assigning a specific monitor serial # to you.  Billing and Patient Assistance Program Information  We have supplied [employer] with any of your insurance information on file for billing purposes. [employer] offers a sliding scale Patient Assistance Program for patients that do not have  insurance, or whose insurance does not completely cover the cost of the ZIO monitor.  You must apply for the Patient Assistance Program to qualify for this discounted rate.   To apply, please call [employer] at [phone], select option 4, select option 2, ask to apply for  Patient Assistance Program. Meredeth Ide will ask your household income, and how many people  are in your household. They will quote your out-of-pocket cost based on that information.  [employer] will also be able to set up a 79-month, interest-free payment plan if needed.  Applying the monitor   Shave hair from upper left chest.  Hold abrader disc by orange tab. Rub abrader in 40 strokes over the upper left chest as  indicated in your monitor instructions.  Clean area with 4 enclosed alcohol pads. Let dry.  Apply patch as indicated in monitor instructions. Patch will be placed under collarbone on left  side of chest with arrow pointing upward.  Rub patch adhesive wings for 2 minutes. Remove white label marked "1". Remove the white  label marked "2". Rub patch adhesive wings for 2 additional minutes.  While looking in a mirror, press and release button in center of patch. A small green light will  flash 3-4 times. This will be your only indicator that the monitor has been turned on.  Do not shower for the first 24 hours. You may shower after the first 24 hours.  Press the button if you feel a symptom. You will hear a small click. Record Date, Time and  Symptom in the Patient Logbook.  When you are ready to remove the patch, follow instructions on the last 2 pages of Patient  Logbook. Stick patch monitor onto the last page of Patient Logbook.  Place Patient Logbook in the blue and white box. Use locking tab on box and tape box closed  securely. The blue and white box has prepaid postage on it. Please place it in the mailbox as  soon as possible. Your physician should have your test results approximately 7 days after the  monitor has been mailed back to [facility].  Call [facility] Customer Care at [phone] if you have questions regarding  your ZIO XT patch monitor. Call them immediately  if you see an orange light blinking on your  monitor.  If your monitor falls off in less than 4 days, contact our Monitor department at [phone].  If your monitor becomes loose or falls off after 4 days call [employer] at [phone] for  suggestions on securing your monitor     Signed, Christell Constant, MD  02/11/2023 1:03 PM    [facility]

## 2023-02-11 ENCOUNTER — Encounter: Payer: Self-pay | Admitting: Internal Medicine

## 2023-02-11 ENCOUNTER — Ambulatory Visit (INDEPENDENT_AMBULATORY_CARE_PROVIDER_SITE_OTHER): Payer: Commercial Managed Care - PPO

## 2023-02-11 ENCOUNTER — Ambulatory Visit: Payer: Commercial Managed Care - PPO | Attending: Internal Medicine | Admitting: Internal Medicine

## 2023-02-11 VITALS — BP 130/80 | HR 55 | Ht 62.5 in | Wt 197.0 lb

## 2023-02-11 DIAGNOSIS — I10 Essential (primary) hypertension: Secondary | ICD-10-CM | POA: Diagnosis not present

## 2023-02-11 DIAGNOSIS — R002 Palpitations: Secondary | ICD-10-CM | POA: Diagnosis not present

## 2023-02-11 DIAGNOSIS — E782 Mixed hyperlipidemia: Secondary | ICD-10-CM

## 2023-02-11 DIAGNOSIS — I421 Obstructive hypertrophic cardiomyopathy: Secondary | ICD-10-CM | POA: Diagnosis not present

## 2023-02-11 DIAGNOSIS — Z8249 Family history of ischemic heart disease and other diseases of the circulatory system: Secondary | ICD-10-CM

## 2023-02-11 DIAGNOSIS — R001 Bradycardia, unspecified: Secondary | ICD-10-CM

## 2023-02-11 NOTE — Progress Notes (Unsigned)
Applied a 14 day Zio XT monitor to patient in the office ?

## 2023-02-11 NOTE — Patient Instructions (Addendum)
Medication Instructions:  Your physician recommends that you continue on your current medications as directed. Please refer to the Current Medication list given to you today.  *If you need a refill on your cardiac medications before your next appointment, please call your pharmacy*   Lab Work: NONE  If you have labs (blood work) drawn today and your tests are completely normal, you will receive your results only by: MyChart Message (if you have MyChart) OR A paper copy in the mail If you have any lab test that is abnormal or we need to change your treatment, we will call you to review the results.   Testing/Procedures: Your physician has requested that you wear a heart monitor.   IN 5 YEARS: Your physician has requested that you have an echocardiogram. Echocardiography is a painless test that uses sound waves to create images of your heart. It provides your doctor with information about the size and shape of your heart and how well your heart's chambers and valves are working. This procedure takes approximately one hour. There are no restrictions for this procedure. Please do NOT wear cologne, perfume, aftershave, or lotions (deodorant is allowed). Please arrive 15 minutes prior to your appointment time.    Follow-Up: At [facility], you and your health needs are our priority.  As part of our continuing mission to provide you with exceptional heart care, we have created designated Provider Care Teams.  These Care Teams include your primary Cardiologist (physician) and Advanced Practice Providers (APPs -  Physician Assistants and Nurse Practitioners) who all work together to provide you with the care you need, when you need it.  Your next appointment:   1 year(s)  Provider:   Christell Constant, MD     Other Instructions Christena Deem- Long Term Monitor Instructions  Your physician has requested you wear a ZIO patch monitor for 14 days.  This is a single patch monitor.  [employer] supplies one patch monitor per enrollment. Additional stickers are not available. Please do not apply patch if you will be having a Nuclear Stress Test,   Cardiac CT, MRI, or Chest Xray during the period you would be wearing the  monitor. The patch cannot be worn during these tests. You cannot remove and re-apply the  ZIO XT patch monitor.  Your ZIO patch monitor will be mailed 3 day USPS to your address on file. It may take 3-5 days  to receive your monitor after you have been enrolled.  Once you have received your monitor, please review the enclosed instructions. Your monitor  has already been registered assigning a specific monitor serial # to you.  Billing and Patient Assistance Program Information  We have supplied [employer] with any of your insurance information on file for billing purposes. [employer] offers a sliding scale Patient Assistance Program for patients that do not have  insurance, or whose insurance does not completely cover the cost of the ZIO monitor.  You must apply for the Patient Assistance Program to qualify for this discounted rate.  To apply, please call [employer] at [phone], select option 4, select option 2, ask to apply for  Patient Assistance Program. Meredeth Ide will ask your household income, and how many people  are in your household. They will quote your out-of-pocket cost based on that information.  [employer] will also be able to set up a 57-month, interest-free payment plan if needed.  Applying the monitor   Shave hair from upper left chest.  Hold abrader disc by  orange tab. Rub abrader in 40 strokes over the upper left chest as  indicated in your monitor instructions.  Clean area with 4 enclosed alcohol pads. Let dry.  Apply patch as indicated in monitor instructions. Patch will be placed under collarbone on left  side of chest with arrow pointing upward.  Rub patch adhesive wings for 2 minutes. Remove white label marked "1". Remove the white  label  marked "2". Rub patch adhesive wings for 2 additional minutes.  While looking in a mirror, press and release button in center of patch. A small green light will  flash 3-4 times. This will be your only indicator that the monitor has been turned on.  Do not shower for the first 24 hours. You may shower after the first 24 hours.  Press the button if you feel a symptom. You will hear a small click. Record Date, Time and  Symptom in the Patient Logbook.  When you are ready to remove the patch, follow instructions on the last 2 pages of Patient  Logbook. Stick patch monitor onto the last page of Patient Logbook.  Place Patient Logbook in the blue and white box. Use locking tab on box and tape box closed  securely. The blue and white box has prepaid postage on it. Please place it in the mailbox as  soon as possible. Your physician should have your test results approximately 7 days after the  monitor has been mailed back to [facility].  Call [facility] Customer Care at [phone] if you have questions regarding  your ZIO XT patch monitor. Call them immediately if you see an orange light blinking on your  monitor.  If your monitor falls off in less than 4 days, contact our Monitor department at [phone].  If your monitor becomes loose or falls off after 4 days call [employer] at [phone] for  suggestions on securing your monitor

## 2023-02-17 ENCOUNTER — Ambulatory Visit: Payer: Commercial Managed Care - PPO

## 2023-03-03 ENCOUNTER — Ambulatory Visit: Payer: Commercial Managed Care - PPO

## 2023-03-03 ENCOUNTER — Ambulatory Visit
Admission: RE | Admit: 2023-03-03 | Discharge: 2023-03-03 | Disposition: A | Discharge status: Home / Self Care | Payer: Commercial Managed Care - PPO | Source: Ambulatory Visit | Attending: Family Medicine | Admitting: Family Medicine

## 2023-03-03 DIAGNOSIS — Z0000 Encounter for general adult medical examination without abnormal findings: Secondary | ICD-10-CM

## 2024-03-01 ENCOUNTER — Other Ambulatory Visit: Payer: Self-pay | Admitting: Obstetrics and Gynecology

## 2024-03-01 ENCOUNTER — Other Ambulatory Visit: Payer: Self-pay | Admitting: Pulmonary Disease

## 2024-03-01 DIAGNOSIS — Z1231 Encounter for screening mammogram for malignant neoplasm of breast: Secondary | ICD-10-CM

## 2024-03-14 ENCOUNTER — Ambulatory Visit

## 2024-03-16 ENCOUNTER — Ambulatory Visit
Admission: RE | Admit: 2024-03-16 | Discharge: 2024-03-16 | Disposition: A | Discharge status: Home / Self Care | Source: Ambulatory Visit | Attending: Obstetrics and Gynecology | Admitting: Obstetrics and Gynecology

## 2024-03-16 ENCOUNTER — Ambulatory Visit

## 2024-03-16 DIAGNOSIS — Z1231 Encounter for screening mammogram for malignant neoplasm of breast: Secondary | ICD-10-CM
